# Patient Record
Sex: Male | Born: 1965
Health system: Southern US, Community
[De-identification: ages and names within clinical notes are randomized; demographics above are authoritative.]

## PROBLEM LIST (undated history)

## (undated) DIAGNOSIS — E079 Disorder of thyroid, unspecified: Secondary | ICD-10-CM

## (undated) DIAGNOSIS — R0600 Dyspnea, unspecified: Secondary | ICD-10-CM

## (undated) DIAGNOSIS — I10 Essential (primary) hypertension: Secondary | ICD-10-CM

## (undated) DIAGNOSIS — G4730 Sleep apnea, unspecified: Secondary | ICD-10-CM

## (undated) DIAGNOSIS — M79672 Pain in left foot: Secondary | ICD-10-CM

## (undated) DIAGNOSIS — E785 Hyperlipidemia, unspecified: Secondary | ICD-10-CM

## (undated) DIAGNOSIS — M79606 Pain in leg, unspecified: Secondary | ICD-10-CM

## (undated) DIAGNOSIS — R1310 Dysphagia, unspecified: Secondary | ICD-10-CM

## (undated) DIAGNOSIS — M79671 Pain in right foot: Secondary | ICD-10-CM

## (undated) DIAGNOSIS — K219 Gastro-esophageal reflux disease without esophagitis: Secondary | ICD-10-CM

## (undated) DIAGNOSIS — F419 Anxiety disorder, unspecified: Secondary | ICD-10-CM

## (undated) DIAGNOSIS — R600 Localized edema: Secondary | ICD-10-CM

## (undated) DIAGNOSIS — M2550 Pain in unspecified joint: Secondary | ICD-10-CM

## (undated) DIAGNOSIS — M549 Dorsalgia, unspecified: Secondary | ICD-10-CM

## (undated) HISTORY — DX: Localized edema: R60.0

## (undated) HISTORY — DX: Dyspnea, unspecified: R06.00

## (undated) HISTORY — DX: Pain in unspecified joint: M25.50

## (undated) HISTORY — DX: Dysphagia, unspecified: R13.10

## (undated) HISTORY — DX: Essential (primary) hypertension: I10

## (undated) HISTORY — DX: Gastro-esophageal reflux disease without esophagitis: K21.9

## (undated) HISTORY — DX: Disorder of thyroid, unspecified: E07.9

## (undated) HISTORY — DX: Pain in leg, unspecified: M79.606

## (undated) HISTORY — DX: Pain in left foot: M79.672

## (undated) HISTORY — DX: Pain in right foot: M79.671

## (undated) HISTORY — DX: Sleep apnea, unspecified: G47.30

## (undated) HISTORY — DX: Anxiety disorder, unspecified: F41.9

## (undated) HISTORY — DX: Hyperlipidemia, unspecified: E78.5

## (undated) HISTORY — DX: Dorsalgia, unspecified: M54.9

## (undated) HISTORY — PX: FINGER SURGERY: SHX640

---

## 2014-03-02 DIAGNOSIS — Z6841 Body Mass Index (BMI) 40.0 and over, adult: Secondary | ICD-10-CM

## 2014-08-17 DIAGNOSIS — E785 Hyperlipidemia, unspecified: Secondary | ICD-10-CM | POA: Insufficient documentation

## 2017-02-15 ENCOUNTER — Ambulatory Visit (INDEPENDENT_AMBULATORY_CARE_PROVIDER_SITE_OTHER): Payer: BLUE CROSS/BLUE SHIELD | Admitting: Pediatrics

## 2017-02-15 ENCOUNTER — Encounter: Payer: Self-pay | Admitting: Pediatrics

## 2017-02-15 VITALS — BP 124/83 | HR 84 | Temp 97.8°F | Ht 73.0 in | Wt 326.6 lb

## 2017-02-15 DIAGNOSIS — Z0000 Encounter for general adult medical examination without abnormal findings: Secondary | ICD-10-CM

## 2017-02-15 DIAGNOSIS — R609 Edema, unspecified: Secondary | ICD-10-CM

## 2017-02-15 DIAGNOSIS — Z6841 Body Mass Index (BMI) 40.0 and over, adult: Secondary | ICD-10-CM

## 2017-02-15 DIAGNOSIS — R0683 Snoring: Secondary | ICD-10-CM

## 2017-02-15 LAB — BAYER DCA HB A1C WAIVED: HB A1C (BAYER DCA - WAIVED): 5.1 %

## 2017-02-15 MED ORDER — HYDROCHLOROTHIAZIDE 12.5 MG PO TABS: 12.5000 mg | ORAL_TABLET | Freq: Every day | ORAL | 3 refills | Status: DC

## 2017-02-15 NOTE — Progress Notes (Signed)
  Subjective:   Patient ID: Joe Romero, male    DOB: [birth year], 51 y.o.   MRN: [id number] CC: Annual Exam  HPI: Joe Romero is a 51 y.o. 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 often Says he eats when he worries Sometimes 4-5 meals a day Will make himself a hamburger between meals Wife, daughter, father live at home They are also all interested in healthier eating Drinks 10-12 sodas a day, mostly diet at home bc father with Dm[employer]  Snores heavily, has been told he has pauses with his breathing by family members  Not regularly exercising, gets tired more easily compared to the past Denies chest pain/pressure with exercise  PMH: overweight  Fam HX: no family h/o prostate cancer or colon ca  Social History   Social History  . Marital status: Married    Spouse name: N/A  . Number of children: N/A  . Years of education: N/A   Social History Main Topics  . Smoking status: Former Smoker    Quit date: 02/16/2007  . Smokeless tobacco: Never Used  . Alcohol use No  . Drug use: No  . Sexual activity: Not Asked   Other Topics Concern  . None   Social History Narrative  . None   ROS: All systems negative other than what is in HPI  Objective:    BP 124/83   Pulse 84   Temp 97.8 F (36.6 C) (Oral)   Ht '6\' 1"'$  (1.854 m)   Wt (!) 326 lb 9.6 oz (148.1 kg)   BMI 43.09 kg/m   Wt Readings from Last 3 Encounters:  02/15/17 (!) 326 lb 9.6 oz (148.1 kg)    Gen: NAD, alert, cooperative with exam, NCAT EYES: EOMI, no conjunctival injection, or no icterus ENT:  TMs pearly gray b/l, OP without erythema LYMPH: no cervical LAD CV: NRRR, normal S1/S2, no murmur, distal pulses 2+ b/l Resp: CTABL, no wheezes, normal WOB Abd: +BS, soft, NTND, obese. no guarding or organomegaly Ext: trace  pitting edema, warm Neuro: Alert and oriented, strength equal b/l UE and LE, coordination grossly normal MSK: normal muscle bulk  Assessment & Plan:  Joe Romero was seen today for annual exam.  Diagnoses and all orders for this visit:  Encounter for preventive health examination -     CMP14+EGFR -     Lipid panel  BMI 40.0-44.9, adult ([place]) Pt going to schedule appt to talk with Tammy about nutrition Goal three meals a day, decreasing eating out, no seconds -     TSH -     Bayer DCA Hb A1c Waived  Swelling Goes down by the morning Start below -     hydrochlorothiazide (HYDRODIURIL) 12.5 MG tablet; Take 1 tablet (12.5 mg total) by mouth daily.  Snoring -     Ambulatory referral to Neurology   Follow up plan: Return in about 2 months (around 04/17/2017). Assunta Found, MD [place]

## 2017-02-15 NOTE — Patient Instructions (Signed)
Schedule appt to see Tammy to talk about nutrition when able

## 2017-02-16 LAB — CMP14+EGFR
ALT: 40 IU/L (ref 0–44)
AST: 25 IU/L (ref 0–40)
Albumin/Globulin Ratio: 1.7 (ref 1.2–2.2)
Albumin: 4.3 g/dL (ref 3.5–5.5)
Alkaline Phosphatase: 54 IU/L (ref 39–117)
BILIRUBIN TOTAL: 0.4 mg/dL (ref 0.0–1.2)
BUN/Creatinine Ratio: 15 (ref 9–20)
BUN: 16 mg/dL (ref 6–24)
CHLORIDE: 99 mmol/L (ref 96–106)
CO2: 26 mmol/L (ref 20–29)
Calcium: 8.9 mg/dL (ref 8.7–10.2)
Creatinine, Ser: 1.05 mg/dL (ref 0.76–1.27)
GFR calc non Af Amer: 83 mL/min/{1.73_m2} (ref >59)
GFR, EST AFRICAN AMERICAN: 96 mL/min/{1.73_m2} (ref >59)
GLUCOSE: 91 mg/dL (ref 65–99)
Globulin, Total: 2.5 g/dL (ref 1.5–4.5)
Potassium: 4.6 mmol/L (ref 3.5–5.2)
Sodium: 141 mmol/L (ref 134–144)
TOTAL PROTEIN: 6.8 g/dL (ref 6.0–8.5)

## 2017-02-16 LAB — LIPID PANEL
CHOLESTEROL TOTAL: 186 mg/dL (ref 100–199)
Chol/HDL Ratio: 5.2 ratio — ABNORMAL HIGH (ref 0.0–5.0)
HDL: 36 mg/dL — AB (ref >39)
LDL Calculated: 117 mg/dL — ABNORMAL HIGH (ref 0–99)
TRIGLYCERIDES: 167 mg/dL — AB (ref 0–149)
VLDL CHOLESTEROL CAL: 33 mg/dL (ref 5–40)

## 2017-02-16 LAB — TSH: TSH: 12.09 u[IU]/mL — ABNORMAL HIGH (ref 0.450–4.500)

## 2017-02-18 ENCOUNTER — Ambulatory Visit: Payer: BLUE CROSS/BLUE SHIELD | Admitting: Pharmacist

## 2017-02-18 ENCOUNTER — Encounter: Payer: Self-pay | Admitting: Neurology

## 2017-02-18 ENCOUNTER — Ambulatory Visit (INDEPENDENT_AMBULATORY_CARE_PROVIDER_SITE_OTHER): Payer: BLUE CROSS/BLUE SHIELD | Admitting: Neurology

## 2017-02-18 ENCOUNTER — Other Ambulatory Visit: Payer: Self-pay | Admitting: Pediatrics

## 2017-02-18 VITALS — BP 132/85 | HR 84 | Ht 73.0 in | Wt 324.0 lb

## 2017-02-18 DIAGNOSIS — Z6841 Body Mass Index (BMI) 40.0 and over, adult: Secondary | ICD-10-CM

## 2017-02-18 DIAGNOSIS — R0683 Snoring: Secondary | ICD-10-CM

## 2017-02-18 DIAGNOSIS — G2581 Restless legs syndrome: Secondary | ICD-10-CM

## 2017-02-18 DIAGNOSIS — R351 Nocturia: Secondary | ICD-10-CM | POA: Diagnosis not present

## 2017-02-18 DIAGNOSIS — E039 Hypothyroidism, unspecified: Secondary | ICD-10-CM

## 2017-02-18 DIAGNOSIS — R600 Localized edema: Secondary | ICD-10-CM

## 2017-02-18 DIAGNOSIS — R0681 Apnea, not elsewhere classified: Secondary | ICD-10-CM | POA: Diagnosis not present

## 2017-02-18 DIAGNOSIS — R400 Somnolence: Secondary | ICD-10-CM | POA: Diagnosis not present

## 2017-02-18 MED ORDER — LEVOTHYROXINE SODIUM 112 MCG PO TABS: 112.0000 ug | ORAL_TABLET | Freq: Every day | ORAL | 2 refills | Status: DC

## 2017-02-18 NOTE — Progress Notes (Signed)
Subjective:    Patient ID: Joe SessionsRicky Romero is a 51 y.o. male.  HPI     Joe FoleySaima Lord Lancour, MD, PhD [facility] [address], [address] [address] [place], [place] [address]  Dear Dr. Oswaldo DoneVincent,   I saw your patient, Joe Romero, upon your kind request in my neurologic clinic today for initial consultation of hissleep disorder, in particular, concern for underlying obstructive sleep apnea. The patient is unaccompanied today. As you know, Joe Romero is a 51 year old right-handed gentleman with an underlying medical history of lower extremity edema and morbid obesity with a BMI of over 40, who reports snoring and excessive daytime somnolence. He has woken up with a sense of gasping for air and wife has noted apneic pauses while he is asleep. I reviewed your office note from 02/15/2017. His ESS is 16/24, fatigue score is 23/63. He goes to bed between 8:30 and 9:30 PM, rise time is around 4:15 AM, at work by 6 AM. He works at [employer], Consulting civil engineermanager for equipment maintenance. He has RLS symptoms, sometimes bothersome to him, has nocturia, now since he started on HCTZ and lost about 6 lb. Swelling is much better. Denies AM HAs. Wakes up with dry mouth a lot, sleeps with mouth open. Was found to have elevated TSH and is supposed to start Synthroid per chart review. He quit smoking in 2010, he does not drink any alcohol regularly, maybe once or twice a year or so, he has been drinking quite a bit of caffeine until recently when he was advised to reduce his caffeine intake. He was drinking up to 10 caffeine-containing sodas per day but has been switching to decaf and has tried to increase his water intake. He has no known family history of sleep apnea. He has 4 children and 4 grandchildren, another grandchild on the way.   His Past Medical History Is Significant For: No past medical history on file.  His Past Surgical History Is Significant For: Past Surgical History:  Procedure Laterality Date  .  FINGER SURGERY Right    torn ligament    His Family History Is Significant For: No family history on file.  His Social History Is Significant For: Social History   Social History  . Marital status: Married    Spouse name: N/A  . Number of children: N/A  . Years of education: N/A   Social History Main Topics  . Smoking status: Former Smoker    Quit date: 02/16/2007  . Smokeless tobacco: Never Used  . Alcohol use No  . Drug use: No  . Sexual activity: Not Asked   Other Topics Concern  . None   Social History Narrative  . None    His Allergies Are:  No Known Allergies:   His Current Medications Are:  Outpatient Encounter Prescriptions as of 02/18/2017  Medication Sig  . hydrochlorothiazide (HYDRODIURIL) 12.5 MG tablet Take 1 tablet (12.5 mg total) by mouth daily.  Marland Kitchen. levothyroxine (SYNTHROID, LEVOTHROID) 112 MCG tablet Take 1 tablet (112 mcg total) by mouth daily. (Patient not taking: Reported on 02/18/2017)   No facility-administered encounter medications on file as of 02/18/2017.   :  Review of Systems:  Out of a complete 14 point review of systems, all are reviewed and negative with the exception of these symptoms as listed below: Review of Systems  Neurological:       Pt presents today to discuss his sleep. Pt has never had a sleep study but does endorse snoring.  Epworth Sleepiness Scale 0= would never doze 1= slight chance of dozing 2= moderate chance of dozing 3= high chance of dozing  Sitting and reading: 3 Watching TV: 3 Sitting inactive in a public place (ex. Theater or meeting): 1 As a passenger in a car for an hour without a break: 3 Lying down to rest in the afternoon: 3 Sitting and talking to someone: 1 Sitting quietly after lunch (no alcohol): 2 In a car, while stopped in traffic: 0 Total: 16     Objective:  Neurologic Exam  Physical Exam Physical Examination:   Vitals:   02/18/17 0832  BP: 132/85  Pulse: 84    General  Examination: The patient is a very pleasant 51 y.o. male in no acute distress. He appears well-developed and well-nourished and well groomed.   HEENT: Normocephalic, atraumatic, pupils are equal, round and reactive to light and accommodation. Funduscopic exam is normal with sharp disc margins noted. Extraocular tracking is good without limitation to gaze excursion or nystagmus noted. Normal smooth pursuit is noted. Hearing is grossly intact. Face is symmetric with normal facial animation and normal facial sensation. Speech is clear with no dysarthria noted. There is no hypophonia. There is no lip, neck/head, jaw or voice tremor. Neck is supple with full range of passive and active motion. There are no carotid bruits on auscultation. Oropharynx exam reveals: mild mouth dryness, adequate dental hygiene and moderate airway crowding, due to larger uvula, tonsils of 2+ on the R and 1+ on the L. Mallampati is class II. Tongue protrudes centrally and palate elevates symmetrically. Neck size is 19.5 inches. He has a Mild overbite. Nasal inspection reveals no significant nasal mucosal bogginess or redness and no septal deviation.   Chest: Clear to auscultation without wheezing, rhonchi or crackles noted.  Heart: S1+S2+0, regular and normal without murmurs, rubs or gallops noted.   Abdomen: Soft, non-tender and non-distended with normal bowel sounds appreciated on auscultation.  Extremities: There is trace pitting edema in the distal lower extremities bilaterally. Pedal pulses are intact.  Skin: Warm and dry without trophic changes noted.  Musculoskeletal: exam reveals no obvious joint deformities, tenderness or joint swelling or erythema.   Neurologically:  Mental status: The patient is awake, alert and oriented in all 4 spheres. His immediate and remote memory, attention, language skills and fund of knowledge are appropriate. There is no evidence of aphasia, agnosia, apraxia or anomia. Speech is clear with  normal prosody and enunciation. Thought process is linear. Mood is normal and affect is normal.  Cranial nerves II - XII are as described above under HEENT exam. In addition: shoulder shrug is normal with equal shoulder height noted. Motor exam: Normal bulk, strength and tone is noted. There is no drift, tremor or rebound. Romberg is negative. Reflexes are 2+ in the UEs and 1+ in the LEs. Fine motor skills and coordination: intact with normal finger taps, normal hand movements, normal rapid alternating patting, normal foot taps and normal foot agility.  Cerebellar testing: No dysmetria or intention tremor on finger to nose testing. Heel to shin is unremarkable bilaterally. There is no truncal or gait ataxia.  Sensory exam: intact to light touch in the upper and lower extremities.  Gait, station and balance: He stands easily. No veering to one side is noted. No leaning to one side is noted. Posture is age-appropriate and stance is narrow based. Gait shows normal stride length and normal pace. No problems turning are noted. Tandem walk is unremarkable. Intact toe  and heel stance is noted.               Assessment and Plan:   In summary, Joe Romero is a very pleasant 51 y.o.-year old male with an underlying medical history of lower extremity edema, recently diagnosed hypothyroidism, and morbid obesity with a BMI of over 40, whose history and physical exam are concerning for obstructive sleep apnea (OSA). I had a long chat with the patient about my findings and the diagnosis of OSA, its prognosis and treatment options. We talked about medical treatments, surgical interventions and non-pharmacological approaches. I explained in particular the risks and ramifications of untreated moderate to severe OSA, especially with respect to developing cardiovascular disease down the Road, including congestive heart failure, difficult to treat hypertension, cardiac arrhythmias, or stroke. Even type 2 diabetes has, in part,  been linked to untreated OSA. Symptoms of untreated OSA include daytime sleepiness, memory problems, mood irritability and mood disorder such as depression and anxiety, lack of energy, as well as recurrent headaches, especially morning headaches. We talked about trying to maintain a healthy lifestyle in general, as well as the importance of weight control. I encouraged the patient to eat healthy, exercise daily and keep well hydrated, to keep a scheduled bedtime and wake time routine, to not skip any meals and eat healthy snacks in between meals. I advised the patient not to drive when feeling sleepy. I recommended the following at this time: sleep study with potential positive airway pressure titration. (We will score hypopneas at 3%).   I explained the sleep test procedure to the patient and also outlined possible surgical and non-surgical treatment options of OSA, including the use of a custom-made dental device (which would require a referral to a specialist dentist or oral surgeon), upper airway surgical options, such as pillar implants, radiofrequency surgery, tongue base surgery, and UPPP (which would involve a referral to an ENT surgeon). Rarely, jaw surgery such as mandibular advancement may be considered.  I also explained the CPAP treatment option to the patient, who indicated that he would be willing to try CPAP if the need arises. I explained the importance of being compliant with PAP treatment, not only for insurance purposes but primarily to improve His symptoms, and for the patient's long term health benefit, including to reduce His cardiovascular risks. I answered all his questions today and the patient was in agreement. I would like to see him back after the sleep study is completed and encouraged him to call with any interim questions, concerns, problems or updates.   Thank you very much for allowing me to participate in the care of this nice patient. If I can be of any further assistance  to you please do not hesitate to call me at [phone].  Sincerely,   Joe Foley, MD, PhD

## 2017-02-18 NOTE — Patient Instructions (Signed)

## 2017-02-19 LAB — T4, FREE: Free T4: 0.93 ng/dL (ref 0.82–1.77)

## 2017-02-19 LAB — SPECIMEN STATUS REPORT

## 2017-02-19 LAB — T3: T3, Total: 143 ng/dL (ref 71–180)

## 2017-02-22 ENCOUNTER — Ambulatory Visit (INDEPENDENT_AMBULATORY_CARE_PROVIDER_SITE_OTHER): Payer: BLUE CROSS/BLUE SHIELD | Admitting: Pharmacist

## 2017-02-22 VITALS — BP 118/70 | HR 78 | Ht 73.0 in | Wt 323.8 lb

## 2017-02-22 DIAGNOSIS — Z6841 Body Mass Index (BMI) 40.0 and over, adult: Secondary | ICD-10-CM | POA: Diagnosis not present

## 2017-02-22 DIAGNOSIS — R600 Localized edema: Secondary | ICD-10-CM | POA: Diagnosis not present

## 2017-02-22 DIAGNOSIS — E034 Atrophy of thyroid (acquired): Secondary | ICD-10-CM | POA: Diagnosis not present

## 2017-02-22 NOTE — Progress Notes (Signed)
Patient ID: Joe Romero, male   DOB: 06/27/1966, 51 y.o.   MRN: [id number]   Subjective:     Joe SessionsRicky Romero is a 51 y.o. male here for discussion regarding weight loss. He has noted a weight gain of approximately 50 pounds over the last 10 year.  He has recently had concerns about his heart and wants to lose weight for his health He feels ideal weight is 295 pounds. History of eating disorders: none. There is a family history positive for obesity in the patient, father and daughter. Previous treatments for obesity include self-directed dieting. Obesity associated medical conditions: thyroid disease and dyslipidemia (also possibly sleep apnea - patient has been referred for sleep study). Patient was just diagnosed with hypothyroidism and started on thyroid replacement which might have led to some on his weight gain. He reports he already feels much better.  Obesity associated medications: none. Cardiovascular risk factors besides obesity: dyslipidemia, male gender, obesity (BMI >= 30 kg/m2) and sedentary lifestyle.  Patient is concerned about weight and diabetes risk.  Both his father and wife have DM.  Household is patient, wife, daughter and father.  Meal Prep - pt, wife and daughter Marine scientistGrocery Shopping - pt, wife and daughter  Current Diet:  About 10 sodas per day - has decreased some and is drinking more water over the last week.   Mentions he belongs to the "clean plate club" - does not like to waste food Likes all foods.  Eats out 6 to 8 times per week  Outpatient Encounter Prescriptions as of 02/22/2017  Medication Sig  . hydrochlorothiazide (HYDRODIURIL) 12.5 MG tablet Take 1 tablet (12.5 mg total) by mouth daily.  Marland Kitchen. levothyroxine (SYNTHROID, LEVOTHROID) 112 MCG tablet Take 1 tablet (112 mcg total) by mouth daily. (Patient not taking: Reported on 02/18/2017)   No facility-administered encounter medications on file as of 02/22/2017.     The following portions of the patient's history were  reviewed and updated as appropriate: allergies, current medications, past family history, past medical history, past social history, past surgical history and problem list.    Objective:    Body mass index is 42.72 kg/m.   Weight has decreased about 4 lbs since last week  Today's Vitals   02/22/17 1117  BP: 118/70  Pulse: 78  Weight: (!) 323 lb 12.8 oz (146.9 kg)  Height: 6\' 1"  (1.854 m)    Assessment:    Obesity. I assessed Joe Romero to be in an action stage with respect to weight loss.    Plan:    General weight loss/lifestyle modification strategies discussed (elicit support from others; identify saboteurs; non-food rewards, etc). Behavioral treatment: stress management. Diet interventions: moderate (500 kCal/d) deficit diet and discussed limiting high calorie foods; limit sugar and processed foods.  Increase lean proteins, vegetables and fruits. .Discussed portion sizes. Samples diet given. Regular aerobic exercise program discussed. Medication: discussed Saxanda - checking his insurance coverage.  Patient does not want any meds that might have cardio adverse effects.. Follow up in: 4 weeks

## 2017-02-22 NOTE — Patient Instructions (Signed)
Increase exercise - goal is to get 150 minutes per week - walking is great!  Smaller meals with snacks - ideally eating every 4 hours

## 2017-02-23 DIAGNOSIS — R609 Edema, unspecified: Secondary | ICD-10-CM | POA: Insufficient documentation

## 2017-02-23 DIAGNOSIS — E039 Hypothyroidism, unspecified: Secondary | ICD-10-CM | POA: Insufficient documentation

## 2017-03-15 ENCOUNTER — Ambulatory Visit (INDEPENDENT_AMBULATORY_CARE_PROVIDER_SITE_OTHER): Payer: BLUE CROSS/BLUE SHIELD | Admitting: Neurology

## 2017-03-15 DIAGNOSIS — G4761 Periodic limb movement disorder: Secondary | ICD-10-CM

## 2017-03-15 DIAGNOSIS — G4720 Circadian rhythm sleep disorder, unspecified type: Secondary | ICD-10-CM

## 2017-03-15 DIAGNOSIS — G4733 Obstructive sleep apnea (adult) (pediatric): Secondary | ICD-10-CM | POA: Diagnosis not present

## 2017-03-19 NOTE — Procedures (Signed)
PATIENT'S NAME:  Romero, Joe DOB:      1966-02-26      MR#:    [id number]     DATE OF RECORDING: 03/15/2017 REFERRING M.D.:  Rex Kras, MD Study Performed:  Split-Night Titration Study HISTORY: 51 year old man with a history of lower extremity edema and morbid obesity, who reports snoring and excessive daytime somnolence. The patient endorsed the Epworth Sleepiness Scale at 16 points. The patient's weight 324 pounds with a height of 73 (inches), resulting in a BMI of 43. Kg/m2. The patient's neck circumference measured 19.5 inches.  CURRENT MEDICATIONS: Hydrodiuril; Synthroid   PROCEDURE:  This is a multichannel digital polysomnogram utilizing the Somnostar 11.2 system.  Electrodes and sensors were applied and monitored per AASM Specifications.   EEG, EOG, Chin and Limb EMG, were sampled at 200 Hz.  ECG, Snore and Nasal Pressure, Thermal Airflow, Respiratory Effort, CPAP Flow and Pressure, Oximetry was sampled at 50 Hz. Digital video and audio were recorded.      BASELINE STUDY WITHOUT CPAP RESULTS:  Lights Out was at 21:04 and Lights On at 05:00 for the night, CPAP initiated at 23:05, epoch 249. Total recording time (TRT) was 119.5, with a total sleep time (TST) of 106 minutes.  The patient's sleep latency was 13.5 minutes, REM sleep was absent. The sleep efficiency was 88.7 %.    SLEEP ARCHITECTURE: WASO (Wake after sleep onset) was 4.5 minutes, Stage N1 was 4 minutes, Stage N2 was 55.5 minutes, Stage N3 was 46.5 minutes and Stage R (REM sleep) was 0 minutes.  The percentages were Stage N1 3.8%, Stage N2 52.4%, Stage N3 43.9% and Stage R (REM sleep) was absent.  The arousals were noted as: 3 were spontaneous, 0 were associated with PLMs, 72 were associated with respiratory events.  Audio and video analysis did not show any abnormal or unusual movements, behaviors, phonations or vocalizations.  The patient took no bathroom breaks. Moderate to severe snoring was noted. The EKG was in keeping with  normal sinus rhythm (NSR).  RESPIRATORY ANALYSIS:  There were a total of 72 respiratory events:  6 obstructive apneas, 0 central apneas and 0 mixed apneas with a total of 6 apneas and an apnea index (AI) of 3.4. There were 66 hypopneas with a hypopnea index of 37.4. The patient also had 0 respiratory event related arousals (RERAs).  Snoring was noted.     The total APNEA/HYPOPNEA INDEX (AHI) was 40.8 /hour and the total RESPIRATORY DISTURBANCE INDEX was 40.8 /hour.  0 events occurred in REM sleep and 132 events in NREM. The REM AHI was 0, /hour versus a non-REM AHI of 40.8 /hour. The patient spent 352.5 minutes sleep time in the supine position 61 minutes in non-supine. The supine AHI was 41.3 /hour versus a non-supine AHI of 40.3 /hour.  OXYGEN SATURATION & C02:  The wake baseline 02 saturation was 94%, with the lowest being 64%. Time spent below 89% saturation equaled 60 minutes.  PERIODIC LIMB MOVEMENTS: The patient had a total of 48 Periodic Limb Movements.  The Periodic Limb Movement (PLM) index was 27.2 /hour and the PLM Arousal index was 0 /hour.  TITRATION STUDY WITH CPAP RESULTS:   The patient was fitted with medium nasal pillows. CPAP was initiated at 5 cmH20 with heated humidity per AASM split night standards and pressure was advanced to 8 cmH20 because of hypopneas, apneas and desaturations.  At a PAP pressure of 8 cmH20, there was a reduction of the AHI to 0/hour, with  supine REM sleep achieved, O2 nadir of 89%.   Total recording time (TRT) was 357 minutes, with a total sleep time (TST) of 307.5 minutes. The patient's sleep latency was 48 minutes. REM latency was 57 minutes.  The sleep efficiency was 86.1 %.    SLEEP ARCHITECTURE: Wake after sleep was 5 minutes, Stage N1 5 minutes, Stage N2 152 minutes, Stage N3 56.5 minutes and Stage R (REM sleep) 94 minutes. The percentages were: Stage N1 1.6%, Stage N2 49.4%, Stage N3 18.4% and Stage R (REM sleep) 30.6%, in keeping with REM rebound.    The arousals were noted as: 23 were spontaneous, 2 were associated with PLMs, 8 were associated with respiratory events.  RESPIRATORY ANALYSIS:  There were a total of 10 respiratory events: 4 obstructive apneas, 0 central apneas and 0 mixed apneas with a total of 4 apneas and an apnea index (AI) of .8. There were 6 hypopneas with a hypopnea index of 1.2 /hour. The patient also had 0 respiratory event related arousals (RERAs).      The total APNEA/HYPOPNEA INDEX  (AHI) was 2. /hour and the total RESPIRATORY DISTURBANCE INDEX was 2. /hour.  9 events occurred in REM sleep and 1 events in NREM. The REM AHI was 5.7 /hour versus a non-REM AHI of .3 /hour. REM sleep was achieved on a pressure of  cm/h2o (AHI was  .) The patient spent 100% of total sleep time in the supine position. The supine AHI was 2.0 /hour, versus a non-supine AHI of 0.0/hour.  OXYGEN SATURATION & C02:  The wake baseline 02 saturation was 96%, with the lowest being 86%. Time spent below 89% saturation equaled 1 minutes.  PERIODIC LIMB MOVEMENTS: The patient had a total of 74 Periodic Limb Movements. The Periodic Limb Movement (PLM) index was 14.4 /hour and the PLM Arousal index was .4 /hour.  Post-study, the patient indicated that sleep was better than usual.  POLYSOMNOGRAPHY IMPRESSION :   1. Obstructive Sleep Apnea (OSA)  2. Dysfunctions associated with sleep stages or arousals from sleep 3. Periodic Limb Movement Disorder (PLMD)  RECOMMENDATIONS:  1. This patient has severe obstructive sleep apnea and responded well on CPAP therapy. I will, therefore, start the patient on home CPAP treatment at a pressure of 8 cm via medium nasal pillows with heated humidity. The patient should be reminded to be fully compliant with PAP therapy to improve sleep related symptoms and decrease long term cardiovascular risks. Please note that untreated obstructive sleep apnea carries additional perioperative morbidity. Patients with significant  obstructive sleep apnea should receive perioperative PAP therapy and the surgeons and particularly the anesthesiologist should be informed of the diagnosis and the severity of the sleep disordered breathing. 2. Moderate PLMs (periodic limb movements of sleep) were noted during the baseline portion of the study without significant arousals; clinical correlation is recommended. 3. This study shows sleep fragmentation and abnormal sleep stage percentages; these are nonspecific findings and per se do not signify an intrinsic sleep disorder or a cause for the patient's sleep-related symptoms. Causes include (but are not limited to) the first night effect of the sleep study, circadian rhythm disturbances, medication effect or an underlying mood disorder or medical problem.  4. The patient should be cautioned not to drive, work at heights, or operate dangerous or heavy equipment when tired or sleepy. Review and reiteration of good sleep hygiene measures should be pursued with any patient. 5. The patient will [facility]  for discussion of the test results and further management strategies. The referring provider will be notified of the test results.  I certify that I have reviewed the entire raw data recording prior to the issuance of this report in accordance with the Standards of Accreditation of the American Academy of Sleep Medicine (AASM)    Huston Foley, MD, PhD Diplomat, American Board of Psychiatry and Neurology (Neurology and Sleep Medicine)

## 2017-03-19 NOTE — Progress Notes (Signed)
  Patient referred by Dr. Oswaldo DoneVincent, seen by me on 02/18/17, split study on 03/15/17. Please call and notify patient that the recent sleep study confirmed the diagnosis of severe OSA. He did well with CPAP during the study with significant improvement of the respiratory events. Therefore, I would like start the patient on CPAP therapy at home by prescribing a machine for home use. I placed the order in the chart. The patient will need a follow up appointment with me or MM or CM in about 10 weeks post set up that has to be scheduled; please go ahead and schedule while you have the patient on the phone and make sure patient understands the importance of keeping this window for the FU appointment, as it is often an insurance requirement and failing to adhere to this may result in losing coverage for sleep apnea treatment.  Please re-enforce the importance of compliance with treatment and the need for us to monitor compliance data - again an insurance requirement and good feedback for the patient as far as how they are doing.  Also remind patient, that any upcoming CPAP machine or mask issues, should be first addressed with the DME company. Please ask if patient has a preference regarding DME company.  Please arrange for CPAP set up at home through a DME company of patient's choice - once you have spoken to the patient - and faxed/routed report to PCP and referring MD (if other than PCP), you can close this encounter, thanks,   Huston FoleySaima Beva Remund, MD, PhD [facility] ([facility])

## 2017-03-19 NOTE — Addendum Note (Signed)
Addended by: Huston FoleyATHAR, [place] on: 03/19/2017 11:47 AM   Modules accepted: Orders

## 2017-03-22 ENCOUNTER — Telehealth: Payer: Self-pay

## 2017-03-22 NOTE — Telephone Encounter (Signed)
I called pt. I advised pt that Dr. Frances FurbishAthar reviewed their sleep study results and found that pt has severe osa but did well with the cpap during the sleep study with significant improvement of respiratory events. Dr. Frances FurbishAthar recommends that pt start a cpap at home. I reviewed PAP compliance expectations with the pt. Pt is agreeable to starting a CPAP. I advised pt that an order will be sent to a DME, [facility], and [facility] will call the pt within about one week after they file with the pt's insurance. [facility] will show the pt how to use the machine, fit for masks, and troubleshoot the CPAP if needed. A follow up appt was made for insurance purposes with Dr. Frances FurbishAthar on Wednesday, September 19th, 2018 at 1:00pm. Pt verbalized understanding to arrive 15 minutes early and bring their CPAP. A letter with all of this information in it will be mailed to the pt as a reminder. I verified with the pt that the address we have on file is correct. Pt verbalized understanding of results. Pt had no questions at this time but was encouraged to call back if questions arise.

## 2017-03-22 NOTE — Telephone Encounter (Signed)
-----   Message from Huston FoleySaima Athar, MD sent at 03/19/2017 11:47 AM EDT -----  Patient referred by Dr. Oswaldo DoneVincent, seen by me on 02/18/17, split study on 03/15/17. Please call and notify patient that the recent sleep study confirmed the diagnosis of severe OSA. He did well with CPAP during the study with significant improvement of the respiratory events. Therefore, I would like start the patient on CPAP therapy at home by prescribing a machine for home use. I placed the order in the chart. The patient will need a follow up appointment with me or MM or CM in about 10 weeks post set up that has to be scheduled; please go ahead and schedule while you have the patient on the phone and make sure patient understands the importance of keeping this window for the FU appointment, as it is often an insurance requirement and failing to adhere to this may result in losing coverage for sleep apnea treatment.  Please re-enforce the importance of compliance with treatment and the need for us to monitor compliance data - again an insurance requirement and good feedback for the patient as far as how they are doing.  Also remind patient, that any upcoming CPAP machine or mask issues, should be first addressed with the DME company. Please ask if patient has a preference regarding DME company.  Please arrange for CPAP set up at home through a DME company of patient's choice - once you have spoken to the patient - and faxed/routed report to PCP and referring MD (if other than PCP), you can close this encounter, thanks,   Huston FoleySaima Athar, MD, PhD [facility]

## 2017-03-24 ENCOUNTER — Ambulatory Visit (INDEPENDENT_AMBULATORY_CARE_PROVIDER_SITE_OTHER): Payer: BLUE CROSS/BLUE SHIELD | Admitting: Pharmacist

## 2017-03-24 ENCOUNTER — Encounter: Payer: Self-pay | Admitting: Pharmacist

## 2017-03-24 ENCOUNTER — Telehealth: Payer: Self-pay | Admitting: Pharmacist

## 2017-03-24 VITALS — BP 118/70 | HR 80 | Ht 73.0 in | Wt 328.0 lb

## 2017-03-24 DIAGNOSIS — R635 Abnormal weight gain: Secondary | ICD-10-CM | POA: Diagnosis not present

## 2017-03-24 DIAGNOSIS — Z6841 Body Mass Index (BMI) 40.0 and over, adult: Secondary | ICD-10-CM

## 2017-03-24 NOTE — Telephone Encounter (Signed)
Opened n error

## 2017-03-24 NOTE — Progress Notes (Signed)
Patient ID: Joe Romero, male   DOB: 11/01/1965, 51 y.o.   MRN: [id number]   Subjective:     Joe Romero is a 51 y.o. male here to recheck weight.  I had first visit with patient about 4 weeks ago when we discussed diet and exercise.  Patient reports that he has noticed that he is eating less at meals but that he is snacking more.  He mostly snacks while he is driving to help him stay alert.  His job has required him to drive and be out of town more the last month.  He also has sleep study last week which revealed sleep apnea.  He states that the day following the sleep study he felt the best he had in awhile due to using th CPAP for a portion of the night.  He is expecting to get CPAP machine in the next 7 to 10 days.  He is concerned about the effects of his weight on his heart and health.  There is a family history positive for obesity in the patient, father and daughter. Previous treatments for obesity include self-directed dieting. Obesity associated medical conditions: sleep apnea, thyroid disease and dyslipidemia . Hypothyroidism is fairly new diagnosis and started on thyroid replacement about 1 month ago  Obesity associated medications: none. Cardiovascular risk factors besides obesity: dyslipidemia, male gender, obesity (BMI >= 30 kg/m2) and sedentary lifestyle.  Current Diet:  No longer drinking sodas.  Has been eating less for meals but notes that he is snacking more.  Feels that fatigue makes this worse.  Eating more vegetables at home but not when out of town for work.    [place]   Outpatient Encounter Prescriptions as of 03/24/2017  Medication Sig  . hydrochlorothiazide (HYDRODIURIL) 12.5 MG tablet Take 1 tablet (12.5 mg total) by mouth daily.  Marland Kitchen. levothyroxine (SYNTHROID, LEVOTHROID) 112 MCG tablet Take 1 tablet (112 mcg total) by mouth daily. (Patient not taking: Reported on 02/18/2017)   No facility-administered  encounter medications on file as of 03/24/2017.        Objective:    Body mass index is 43.27 kg/m.   Today's Vitals   03/24/17 0933  BP: 118/70  Pulse: 80  Weight: (!) 328 lb (148.8 kg)  Height: 6\' 1"  (1.854 m)     Assessment:    Obesity. I assessed Joe Romero to be in a relapse stage with respect to weight loss.    Plan:    Reviewed General weight loss/lifestyle modification strategies discussed (elicit support from others; identify saboteurs; non-food rewards, etc). Recommended chewing gum during driving to decrease snacking Discussed convenieince foods that are healthier options - fruits, nuts, sunflower seeds, cheese sticks, lean meats.  Diet interventions: moderate (500 kCal/d) deficit diet and discussed limiting high calorie foods; limit sugar and processed foods.  Increase lean proteins, vegetables and fruits. Brouchure given with calorie and CHO count in various foods given.  Regular aerobic exercise program discussed. Medication: discussed phentermine - patient would like to try but his PCP is out of office until July 23rd.  Will send message to Dr Oswaldo DoneVincent to see if she feels would be appropriate.  Follow up in: 4 weeks

## 2017-04-05 ENCOUNTER — Telehealth: Payer: Self-pay | Admitting: Pediatrics

## 2017-04-05 NOTE — Telephone Encounter (Signed)
Tried to call patient to see what question he had.  No answer.

## 2017-04-07 NOTE — Telephone Encounter (Signed)
Patient asked about starting phentermine for weight loss.  I discussed with Dr Oswaldo DoneVincent and she would like to first see if weight improves after C-PAP started.  Patient has follow up with Dr Oswaldo DoneVincent 04/19/17 and will see how much progress he has made with weight at that time.

## 2017-04-19 ENCOUNTER — Ambulatory Visit (INDEPENDENT_AMBULATORY_CARE_PROVIDER_SITE_OTHER): Payer: BLUE CROSS/BLUE SHIELD | Admitting: Pediatrics

## 2017-04-19 ENCOUNTER — Encounter: Payer: Self-pay | Admitting: Pediatrics

## 2017-04-19 VITALS — BP 128/85 | HR 88 | Temp 97.8°F | Ht 73.0 in | Wt 330.4 lb

## 2017-04-19 DIAGNOSIS — R609 Edema, unspecified: Secondary | ICD-10-CM

## 2017-04-19 DIAGNOSIS — Z6841 Body Mass Index (BMI) 40.0 and over, adult: Secondary | ICD-10-CM | POA: Diagnosis not present

## 2017-04-19 DIAGNOSIS — E039 Hypothyroidism, unspecified: Secondary | ICD-10-CM | POA: Diagnosis not present

## 2017-04-19 DIAGNOSIS — G4733 Obstructive sleep apnea (adult) (pediatric): Secondary | ICD-10-CM | POA: Diagnosis not present

## 2017-04-19 MED ORDER — HYDROCHLOROTHIAZIDE 25 MG PO TABS: 25.0000 mg | ORAL_TABLET | Freq: Every day | ORAL | 1 refills | Status: DC

## 2017-04-19 NOTE — Progress Notes (Addendum)
  Subjective:   Patient ID: Joe Romero, male    DOB: 09/16/1965, 51 y.o.   MRN: [id number] CC: Follow-up (2 month) multiple med problems  HPI: Joe Romero is a 51 y.o. male presenting for Follow-up (2 month)  Here to discuss weight loss Notices that if he isnt busy, he eats Goes out ot eat a lot with his wife  Has decreased soda intake, down to 3-4 a day, was drinking 12 sodas a day  Continues to drive back and forth to [place] Sometimes has to stop to get something to eat to stay awake hasnt heard about CPAP machine yet after sleep study  Swelling improved with HCTZ  Relevant past medical, surgical, family and social history reviewed. Allergies and medications reviewed and updated. History  Smoking Status  . Former Smoker  . Quit date: 02/16/2007  Smokeless Tobacco  . Never Used   ROS: Per HPI   Objective:    BP 128/85   Pulse 88   Temp 97.8 F (36.6 C) (Oral)   Ht 6\' 1"  (1.854 m)   Wt (!) 330 lb 6.4 oz (149.9 kg)   BMI 43.59 kg/m   Wt Readings from Last 3 Encounters:  04/19/17 (!) 330 lb 6.4 oz (149.9 kg)  03/24/17 (!) 328 lb (148.8 kg)  02/22/17 (!) 323 lb 12.8 oz (146.9 kg)    Gen: NAD, alert, cooperative with exam, NCAT EYES: EOMI, no conjunctival injection, or no icterus ENT:  OP without erythema LYMPH: no cervical LAD CV: NRRR, normal S1/S2, no murmur, distal pulses 2+ b/l Resp: CTABL, no wheezes, normal WOB Abd: +BS, soft, NTND Ext: No edema, warm Neuro: Alert and oriented MSK: normal muscle bulk  Assessment & Plan:  Joe Romero was seen today for follow-up multiple med problems.  Diagnoses and all orders for this visit:  Hypothyroidism, unspecified type Recheck TSH, cont levothyroxine -     TSH  Swelling Improved, cont HCTZ -     hydrochlorothiazide (HYDRODIURIL) 25 MG tablet; Take 1 tablet (25 mg total) by mouth daily. -     Basic Metabolic Panel  OSA (obstructive sleep apnea) needs machine, will call to d/w neurology  BMI 43 Discussed wt  loss strategies, will start with OSA treatment, decreasing reg sodas to none Working with wife in th epast week to do more meal planning to avoid eating out  Follow up plan: Return in about 3 months (around 07/20/2017). Rex Krasarol Vincent, MD [facility]

## 2017-04-19 NOTE — Patient Instructions (Addendum)
Call Dr Teofilo PodAthar's office about CPAP machine

## 2017-04-20 LAB — BASIC METABOLIC PANEL
BUN / CREAT RATIO: 14 (ref 9–20)
BUN: 15 mg/dL (ref 6–24)
CO2: 25 mmol/L (ref 20–29)
CREATININE: 1.05 mg/dL (ref 0.76–1.27)
Calcium: 8.7 mg/dL (ref 8.7–10.2)
Chloride: 103 mmol/L (ref 96–106)
GFR calc Af Amer: 96 mL/min/{1.73_m2} (ref >59)
GFR calc non Af Amer: 83 mL/min/{1.73_m2} (ref >59)
GLUCOSE: 93 mg/dL (ref 65–99)
POTASSIUM: 4.3 mmol/L (ref 3.5–5.2)
SODIUM: 140 mmol/L (ref 134–144)

## 2017-04-20 LAB — TSH: TSH: 2.22 u[IU]/mL (ref 0.450–4.500)

## 2017-04-21 NOTE — Addendum Note (Signed)
Addended by: Johna SheriffVINCENT, CAROL L on: 04/21/2017 08:52 PM   Modules accepted: Level of Service

## 2017-05-17 ENCOUNTER — Other Ambulatory Visit: Payer: Self-pay | Admitting: Pediatrics

## 2017-05-17 DIAGNOSIS — E039 Hypothyroidism, unspecified: Secondary | ICD-10-CM

## 2017-05-26 ENCOUNTER — Telehealth: Payer: Self-pay

## 2017-05-26 ENCOUNTER — Ambulatory Visit: Payer: Self-pay | Admitting: Neurology

## 2017-05-26 NOTE — Telephone Encounter (Signed)
Pt has an appt with Dr. Frances Furbish today for a new cpap start. It does not appear that pt has started cpap. I called [facility]. Herbert Seta reports that she submitted authorization to [facility] on 03/23/17 and has not received approval yet. She has called [employer] about this already and has tried to contact the pt, but pt is not returning her calls and does not have a VM set up.   I called pt to discuss. No answer, no VM set up.  Pt does not need to follow up today if he has not started his cpap, and should call his insurance to discuss the delay in approval for his cpap. I will try calling the pt again later.

## 2017-05-26 NOTE — Telephone Encounter (Signed)
Pt did not show for their appt with Dr. Athar today.  

## 2017-05-27 ENCOUNTER — Encounter: Payer: Self-pay | Admitting: Neurology

## 2017-07-21 ENCOUNTER — Ambulatory Visit: Payer: BLUE CROSS/BLUE SHIELD | Admitting: Pediatrics

## 2017-11-01 DIAGNOSIS — H00025 Hordeolum internum left lower eyelid: Secondary | ICD-10-CM | POA: Diagnosis not present

## 2017-11-13 DIAGNOSIS — B029 Zoster without complications: Secondary | ICD-10-CM | POA: Diagnosis not present

## 2017-11-13 DIAGNOSIS — Z6841 Body Mass Index (BMI) 40.0 and over, adult: Secondary | ICD-10-CM | POA: Diagnosis not present

## 2017-12-08 ENCOUNTER — Encounter: Payer: Self-pay | Admitting: Pediatrics

## 2017-12-08 ENCOUNTER — Ambulatory Visit: Payer: BLUE CROSS/BLUE SHIELD | Admitting: Pediatrics

## 2017-12-08 VITALS — BP 148/88 | HR 102 | Temp 98.3°F | Ht 73.0 in | Wt 322.8 lb

## 2017-12-08 DIAGNOSIS — G629 Polyneuropathy, unspecified: Secondary | ICD-10-CM

## 2017-12-08 DIAGNOSIS — E785 Hyperlipidemia, unspecified: Secondary | ICD-10-CM

## 2017-12-08 DIAGNOSIS — G4733 Obstructive sleep apnea (adult) (pediatric): Secondary | ICD-10-CM | POA: Diagnosis not present

## 2017-12-08 DIAGNOSIS — R609 Edema, unspecified: Secondary | ICD-10-CM

## 2017-12-08 DIAGNOSIS — Z6841 Body Mass Index (BMI) 40.0 and over, adult: Secondary | ICD-10-CM

## 2017-12-08 DIAGNOSIS — E039 Hypothyroidism, unspecified: Secondary | ICD-10-CM

## 2017-12-08 DIAGNOSIS — I10 Essential (primary) hypertension: Secondary | ICD-10-CM

## 2017-12-08 MED ORDER — GABAPENTIN 300 MG PO CAPS
300.0000 mg | ORAL_CAPSULE | Freq: Two times a day (BID) | ORAL | 1 refills | Status: DC
Start: 2017-12-08 — End: 2018-03-17

## 2017-12-08 MED ORDER — HYDROCHLOROTHIAZIDE 25 MG PO TABS: 25.0000 mg | ORAL_TABLET | Freq: Every day | ORAL | 1 refills | Status: DC

## 2017-12-08 MED ORDER — LISINOPRIL 10 MG PO TABS: 10.0000 mg | ORAL_TABLET | Freq: Every day | ORAL | 3 refills | Status: DC

## 2017-12-08 MED ORDER — LEVOTHYROXINE SODIUM 112 MCG PO TABS: 112.0000 ug | ORAL_TABLET | Freq: Every day | ORAL | 1 refills | Status: DC

## 2017-12-08 NOTE — Patient Instructions (Signed)
Call insurance to see why CPAP machine not yet approved. May need to talk with neurology as well.  Start gabapentin 300mg  twice a day for foot pain  Start lisinopril 10mg  daily in the morning with hydrochlorothiazide for blood pressure  Come back in 1 week for nurse visit for BP check and labs after start of new blood pressure medicine  Schedule appointment to see me in 3 months

## 2017-12-08 NOTE — Progress Notes (Addendum)
Subjective:   Patient ID: Joe Romero, male    DOB: 10/03/1965, 52 y.o.   MRN: [id number] CC: Medication Refill  HPI: Joe Romero is a 52 y.o. male presenting for Medication Refill  Hypothyroidism: Taking his medicine daily  Obstructive sleep apnea: Has not yet received CPAP machine.  Per chart review and talking with patient sounds like insurance approval never came through when he was lost to follow-up.  Hypertension: Taking his medicine regularly.  Some days he had some swelling in his feet after being on his feet all day long.  No chest pain or shortness of breath with exertion.  Elevated BMI: Has been cutting out sugary drinks.  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  Has lost about 12 pounds in the last month.  Foot pain: Ongoing for the last few months.  Was recently put on gabapentin for shingles which helped the foot pain.  Did not cause any sleepiness.  He asks if he can continue.  Relevant past medical, surgical, family and social history reviewed. Allergies and medications reviewed and updated. Social History   Tobacco Use  Smoking Status Former Smoker  . Last attempt to quit: 02/16/2007  . Years since quitting: 10.8  Smokeless Tobacco Never Used   ROS: Per HPI   Objective:    BP (!) 148/88   Pulse (!) 102   Temp 98.3 F (36.8 C) (Oral)   Ht 6\' 1"  (1.854 m)   Wt (!) 322 lb 12.8 oz (146.4 kg)   BMI 42.59 kg/m   Wt Readings from Last 3 Encounters:  12/08/17 (!) 322 lb 12.8 oz (146.4 kg)  04/19/17 (!) 330 lb 6.4 oz (149.9 kg)  03/24/17 (!) 328 lb (148.8 kg)    Gen: NAD, alert, cooperative with exam, NCAT EYES: EOMI, no conjunctival injection, or no icterus CV: NRRR, normal S1/S2, no murmur, distal pulses 2+ b/l Resp: CTABL, no wheezes, normal WOB Abd: +BS, soft, NTND. no guarding or organomegaly Ext: No edema, warm Neuro: Alert and oriented, strength equal b/l UE and LE, coordination grossly normal MSK: normal muscle bulk  Assessment & Plan:  [name]  was seen today for medication refill.  Diagnoses and all orders for this visit:  Essential hypertension Elevated today.  Will start lisinopril.  Return to clinic in 1 week for blood pressure recheck nurse visit and to repeat labs. -     hydrochlorothiazide (HYDRODIURIL) 25 MG tablet; Take 1 tablet (25 mg total) by mouth daily. -     Basic Metabolic Panel; Future -     lisinopril (PRINIVIL,ZESTRIL) 10 MG tablet; Take 1 tablet (10 mg total) by mouth daily.  Swelling Worse when he is been on his feet all day, improved by morning.  No swelling today.  Continue hydrochlorthiazide.  Hypothyroidism, unspecified type -     levothyroxine (SYNTHROID, LEVOTHROID) 112 MCG tablet; Take 1 tablet (112 mcg total) by mouth daily.  Neuropathy -     gabapentin (NEURONTIN) 300 MG capsule; Take 1 capsule (300 mg total) by mouth 2 (two) times daily.  Morbid obesity with BMI of 40.0-44.9, adult (HCC) Lifestyle changes discussed.  Continue decreasing sugary drinks.  Pleased with his weight loss so far.  Hyperlipidemia, unspecified hyperlipidemia type ASCVD risk 6.6%.  Discussed biggest risk factor right now this is elevated blood pressure.  Will work to improve blood pressure.  Obstructive sleep apnea Severe sleep apnea.  Needs CPAP machine.  Patient to call insurance company, may need to get in touch with  neurology as well.  Let me know if he has any trouble.  Follow up plan: 3 months for follow-up with me.  1 week for blood pressure recheck and labs. Rex Kras, MD [facility]

## 2017-12-15 ENCOUNTER — Other Ambulatory Visit: Payer: BLUE CROSS/BLUE SHIELD

## 2017-12-15 ENCOUNTER — Ambulatory Visit: Payer: BLUE CROSS/BLUE SHIELD | Admitting: *Deleted

## 2017-12-15 VITALS — BP 125/75 | HR 101 | Wt 321.6 lb

## 2017-12-15 DIAGNOSIS — Z0130 Encounter for examination of blood pressure without abnormal findings: Secondary | ICD-10-CM

## 2017-12-15 DIAGNOSIS — I10 Essential (primary) hypertension: Secondary | ICD-10-CM | POA: Diagnosis not present

## 2017-12-15 LAB — BASIC METABOLIC PANEL
BUN/Creatinine Ratio: 14 (ref 9–20)
BUN: 16 mg/dL (ref 6–24)
CALCIUM: 9.1 mg/dL (ref 8.7–10.2)
CO2: 26 mmol/L (ref 20–29)
CREATININE: 1.11 mg/dL (ref 0.76–1.27)
Chloride: 98 mmol/L (ref 96–106)
GFR, EST AFRICAN AMERICAN: 89 mL/min/{1.73_m2} (ref >59)
GFR, EST NON AFRICAN AMERICAN: 77 mL/min/{1.73_m2} (ref >59)
Glucose: 94 mg/dL (ref 65–99)
POTASSIUM: 4.5 mmol/L (ref 3.5–5.2)
SODIUM: 139 mmol/L (ref 134–144)

## 2017-12-15 NOTE — Progress Notes (Signed)
Pt here for BP check BP 125 75 P 101

## 2018-01-03 ENCOUNTER — Telehealth: Payer: Self-pay | Admitting: Pediatrics

## 2018-01-03 DIAGNOSIS — I10 Essential (primary) hypertension: Secondary | ICD-10-CM

## 2018-01-03 MED ORDER — LISINOPRIL 10 MG PO TABS: 10.0000 mg | ORAL_TABLET | Freq: Every day | ORAL | 0 refills | Status: DC

## 2018-01-03 NOTE — Telephone Encounter (Signed)
Rx sent to [facility]. Patient notified 

## 2018-03-15 ENCOUNTER — Telehealth: Payer: Self-pay

## 2018-03-15 ENCOUNTER — Telehealth: Payer: Self-pay | Admitting: Neurology

## 2018-03-15 DIAGNOSIS — G4733 Obstructive sleep apnea (adult) (pediatric): Secondary | ICD-10-CM

## 2018-03-15 NOTE — Telephone Encounter (Signed)
Pt wife(on [employer],Brenda [phone]) has called wanting to get pt on CPAP.  She is asking if pt would need to have another sleep study.  Please call

## 2018-03-15 NOTE — Telephone Encounter (Signed)
Has he discussed with the insurance company? He had significant sleep apnea on his sleep study that improved with CPAP.

## 2018-03-15 NOTE — Telephone Encounter (Signed)
I have reached out to [facility] to discuss. 

## 2018-03-15 NOTE — Telephone Encounter (Signed)
Pt has contacted insurance for assistance

## 2018-03-15 NOTE — Telephone Encounter (Signed)
Patient had a sleep study last year but insurance won't pay for CPAP supplies  Need to know what to do

## 2018-03-16 NOTE — Addendum Note (Signed)
Addended by: Geronimo RunningINKINS, Delmy Holdren A on: 03/16/2018 02:04 PM   Modules accepted: Orders

## 2018-03-16 NOTE — Telephone Encounter (Signed)
I called pt and explained this to him. Pt is [facility]. I reiterated compliance expectations and requirements with the pt. Pt scheduled a follow up on 06/13/18 at 9:30am with Dr. Frances FurbishAthar. Pt verbalized understanding of recommendations.

## 2018-03-16 NOTE — Telephone Encounter (Signed)
Per [facility]: "I have checked into this for ya. As long as pt still has a [employer] resources officer[employer]BS commercial insurance plan then they will accept sleep studies up to 51 years old.   [employer] does not require a new office visit note to start therapy at this point.   So for this pt; if he now wants to proceed then we would just need a new rx with pressure settings unless you all require something else on your end."  I called pt. He reports that he wants to start a cpap. Pt tells me that "someone dropped the ball" and was told that his insurance won't cover the machine. He is interested in starting the machine. I advised him that I will speak with Dr. Frances FurbishAthar; he may need another OV but not a sleep study and that I would call him to discuss this. Pt asked me to use his work number to get in touch with him.

## 2018-03-16 NOTE — Telephone Encounter (Signed)
Received VO from Dr. Frances FurbishAthar to renew the order for pt's cpap from 03/19/17 and pt will need an OV 31-90 days after starting cpap.  Order placed.

## 2018-03-17 ENCOUNTER — Encounter: Payer: Self-pay | Admitting: Pediatrics

## 2018-03-17 ENCOUNTER — Ambulatory Visit: Payer: BLUE CROSS/BLUE SHIELD | Admitting: Pediatrics

## 2018-03-17 VITALS — BP 113/73 | HR 90 | Temp 97.5°F | Ht 73.0 in | Wt 329.8 lb

## 2018-03-17 DIAGNOSIS — E039 Hypothyroidism, unspecified: Secondary | ICD-10-CM | POA: Diagnosis not present

## 2018-03-17 DIAGNOSIS — R600 Localized edema: Secondary | ICD-10-CM | POA: Diagnosis not present

## 2018-03-17 DIAGNOSIS — E034 Atrophy of thyroid (acquired): Secondary | ICD-10-CM | POA: Diagnosis not present

## 2018-03-17 DIAGNOSIS — G629 Polyneuropathy, unspecified: Secondary | ICD-10-CM | POA: Diagnosis not present

## 2018-03-17 DIAGNOSIS — I10 Essential (primary) hypertension: Secondary | ICD-10-CM | POA: Diagnosis not present

## 2018-03-17 DIAGNOSIS — Z6841 Body Mass Index (BMI) 40.0 and over, adult: Secondary | ICD-10-CM | POA: Diagnosis not present

## 2018-03-17 MED ORDER — NALTREXONE-BUPROPION HCL ER 8-90 MG PO TB12: ORAL_TABLET | ORAL | 0 refills | Status: DC

## 2018-03-17 MED ORDER — CHLORTHALIDONE 25 MG PO TABS: 25.0000 mg | ORAL_TABLET | Freq: Every day | ORAL | 1 refills | Status: DC

## 2018-03-17 NOTE — Progress Notes (Signed)
Subjective:   Patient ID: Joe Romero, male    DOB: 05/24/1966, 52 y.o.   MRN: [id number] CC: Hypertension (3 mos fu); Hypothyroidism; Hyperlipidemia; and Sleep Apnea  HPI: Joe Romero is a 52 y.o. male   OSA: Has been in contact with insurance company, still has not yet received CPAP machine but has some calls to return per pt  Hypothyroid: Taking medicine regularly.  No cold intolerance  HTN: Taking medicine regularly.  No chest pain, shortness of breath.  No lightheadedness when he stands up.  He does sometimes have swelling in his legs.  Elevated BMI: Very interested in weight loss.  Is been very frustrated that ongoing efforts have not improved weight.  He is watching what he is eating, he is regularly active, says appetite works against him. Interested in medication assistance. Still with untreated sleep apnea as above.  Neuropathy: Leg pain improved with the gabapentin.  Relevant past medical, surgical, family and social history reviewed. Allergies and medications reviewed and updated. Social History   Tobacco Use  Smoking Status Former Smoker  . Last attempt to quit: 02/16/2007  . Years since quitting: 11.0  Smokeless Tobacco Never Used   ROS: Per HPI   Objective:    BP 113/73   Pulse 90   Temp (!) 97.5 F (36.4 C) (Oral)   Ht 6\' 1"  (1.854 m)   Wt (!) 329 lb 12.8 oz (149.6 kg)   BMI 43.51 kg/m   Wt Readings from Last 3 Encounters:  03/17/18 (!) 329 lb 12.8 oz (149.6 kg)  12/15/17 (!) 321 lb 9.6 oz (145.9 kg)  12/08/17 (!) 322 lb 12.8 oz (146.4 kg)    Gen: NAD, alert, cooperative with exam, NCAT EYES: EOMI, no conjunctival injection, or no icterus ENT:  TMs pearly gray b/l, OP without erythema LYMPH: no cervical LAD CV: NRRR, normal S1/S2, no murmur, distal pulses 2+ b/l Resp: CTABL, no wheezes, normal WOB Abd: +BS, soft, NTND. no guarding or organomegaly Ext: No edema, warm Neuro: Alert and oriented, strength equal b/l UE and LE, coordination grossly  normal MSK: normal muscle bulk  Assessment & Plan:  Joe CliffRicky was seen today for hypertension, hypothyroidism, hyperlipidemia and sleep apnea.  Diagnoses and all orders for this visit:  Essential hypertension Stable, continue below -     lisinopril (PRINIVIL,ZESTRIL) 10 MG tablet; Take 1 tablet (10 mg total) by mouth daily.  Morbid obesity with BMI of 40.0-44.9, adult [facility](HCC) Discussed options.  Patient thinks appetite control is been the hardest part.  When he exercises more he gets monthly.  He would like to try prescription medicine.  Will do trial below.  Patient given receiving cards through their website.  If due since 11 you know, can try alternate medication. -     Naltrexone-buPROPion HCl ER (CONTRAVE) 8-90 MG TB12; 1 tablet Po Qam x7d ; then 1 tab po BID x 7 d; then 2 tab in AM and 1 in PM X7d; Then 2 po BID from then on  Localized edema -     chlorthalidone (HYGROTON) 25 MG tablet; Take 1 tablet (25 mg total) by mouth daily.  Neuropathy Stable, continue below -     gabapentin (NEURONTIN) 300 MG capsule; Take 1 capsule (300 mg total) by mouth 2 (two) times daily.  Hypothyroidism, unspecified type Stable, continue below.  Due for recheck. -     levothyroxine (SYNTHROID, LEVOTHROID) 112 MCG tablet; Take 1 tablet (112 mcg total) by mouth daily.   Follow up plan: Return  in about 2 months (around 05/18/2018). Rex Kras, MD [place] [facility]

## 2018-03-17 NOTE — Patient Instructions (Signed)
[email or web address]

## 2018-03-18 LAB — TSH: TSH: 3.77 u[IU]/mL (ref 0.450–4.500)

## 2018-03-18 MED ORDER — LISINOPRIL 10 MG PO TABS: 10.0000 mg | ORAL_TABLET | Freq: Every day | ORAL | 1 refills | Status: DC

## 2018-03-18 MED ORDER — GABAPENTIN 300 MG PO CAPS: 300.0000 mg | ORAL_CAPSULE | Freq: Two times a day (BID) | ORAL | 1 refills | Status: DC

## 2018-03-18 MED ORDER — LEVOTHYROXINE SODIUM 112 MCG PO TABS
112.0000 ug | ORAL_TABLET | Freq: Every day | ORAL | 3 refills | Status: DC
Start: 2018-03-18 — End: 2019-05-17

## 2018-03-21 ENCOUNTER — Telehealth: Payer: Self-pay

## 2018-03-21 DIAGNOSIS — Z6841 Body Mass Index (BMI) 40.0 and over, adult: Principal | ICD-10-CM

## 2018-03-21 MED ORDER — NALTREXONE-BUPROPION HCL ER 8-90 MG PO TB12: 2.0000 | ORAL_TABLET | Freq: Two times a day (BID) | ORAL | 1 refills | Status: DC

## 2018-03-21 NOTE — Telephone Encounter (Signed)
Please let pt know--contrave medicine for weight loss will not be covered by insurance, with the savings card online he should be able to get it discounted as we discussed. If it is still too expensive let me know, we can try alternate medicine.

## 2018-03-21 NOTE — Telephone Encounter (Signed)
Contrave denied by insurance

## 2018-03-21 NOTE — Telephone Encounter (Signed)
Patient aware prescriptions have been sent to pharmacy.

## 2018-03-28 DIAGNOSIS — G4733 Obstructive sleep apnea (adult) (pediatric): Secondary | ICD-10-CM | POA: Diagnosis not present

## 2018-04-04 ENCOUNTER — Telehealth: Payer: Self-pay | Admitting: Pediatrics

## 2018-04-04 NOTE — Telephone Encounter (Signed)
Pt states he hasn't checked his BP but the swelling in his feet and ankles haven't gotten any better with the addition of Chlorthalidone. He also states the Contrave made his eyes "feel like they were going to eplode" so he stopped taking them. Is there something else we can do?

## 2018-04-06 ENCOUNTER — Other Ambulatory Visit: Payer: Self-pay | Admitting: Pediatrics

## 2018-04-06 NOTE — Telephone Encounter (Signed)
D/w pt, stop contrave. He has cut out sodas, drinks, now mostly dirnking water, cutting back on salt, started on CPAP nightly, swelling is much better. Continue current lifestyle changes, f/u as scheduled 3 mo.

## 2018-04-20 DIAGNOSIS — G4733 Obstructive sleep apnea (adult) (pediatric): Secondary | ICD-10-CM | POA: Diagnosis not present

## 2018-04-28 DIAGNOSIS — G4733 Obstructive sleep apnea (adult) (pediatric): Secondary | ICD-10-CM | POA: Diagnosis not present

## 2018-04-29 DIAGNOSIS — G4733 Obstructive sleep apnea (adult) (pediatric): Secondary | ICD-10-CM | POA: Diagnosis not present

## 2018-05-23 ENCOUNTER — Encounter: Payer: Self-pay | Admitting: Pediatrics

## 2018-05-23 ENCOUNTER — Ambulatory Visit (INDEPENDENT_AMBULATORY_CARE_PROVIDER_SITE_OTHER): Payer: BLUE CROSS/BLUE SHIELD | Admitting: Pediatrics

## 2018-05-23 VITALS — BP 129/76 | HR 99 | Temp 97.2°F | Ht 73.0 in | Wt 338.0 lb

## 2018-05-23 DIAGNOSIS — Z6841 Body Mass Index (BMI) 40.0 and over, adult: Secondary | ICD-10-CM | POA: Diagnosis not present

## 2018-05-23 DIAGNOSIS — G4733 Obstructive sleep apnea (adult) (pediatric): Secondary | ICD-10-CM

## 2018-05-23 MED ORDER — LIRAGLUTIDE -WEIGHT MANAGEMENT 18 MG/3ML ~~LOC~~ SOPN: 0.6000 mg | PEN_INJECTOR | Freq: Every day | SUBCUTANEOUS | 2 refills | Status: DC

## 2018-05-23 NOTE — Progress Notes (Signed)
  Subjective:   Patient ID: Joe Romero, male    DOB: 09/28/1965, 52 y.o.   MRN: [id number] CC: Medical Management of Chronic Issues  HPI: Joe Romero is a 52 y.o. male   Elevated BMI: Trial of contrave last visit, patient with intolerable side effects.  He has lost 15 pounds in the last few months when he limits his carb intake.  The weight comes back when he returns to regular diet.  OSA: Now has sleep apnea machine, has been using it for about 10 days.  He does feel better rested with it.  Relevant past medical, surgical, family and social history reviewed. Allergies and medications reviewed and updated. Social History   Tobacco Use  Smoking Status Former Smoker  . Last attempt to quit: 02/16/2007  . Years since quitting: 11.2  Smokeless Tobacco Never Used   ROS: Per HPI   Objective:    BP 129/76   Pulse 99   Temp (!) 97.2 F (36.2 C) (Oral)   Ht 6\' 1"  (1.854 m)   Wt (!) 338 lb (153.3 kg)   BMI 44.59 kg/m   Wt Readings from Last 3 Encounters:  05/23/18 (!) 338 lb (153.3 kg)  03/17/18 (!) 329 lb 12.8 oz (149.6 kg)  12/15/17 (!) 321 lb 9.6 oz (145.9 kg)    Gen: NAD, alert, cooperative with exam, NCAT EYES: EOMI, no conjunctival injection, or no icterus ENT:   OP without erythema LYMPH: no cervical LAD CV: NRRR, normal S1/S2, no murmur, distal pulses 2+ b/l Resp: CTABL, no wheezes, normal WOB Abd: +BS, soft, NTND.  Ext: No pitting edema, warm Neuro: Alert and oriented  Assessment & Plan:  Joe Romero was seen today for medical management of chronic issues.  Diagnoses and all orders for this visit:  Morbid obesity with BMI of 40.0-44.9, adult (HCC) Continue lifestyle modifications, discussed next step options including referral to weight management clinic [facility] [place].  Patient wants to wait on the referral, would like to try below.  Unclear if insurance will cover it. -     Liraglutide -Weight Management (SAXENDA) 18 MG/3ML SOPN; Inject 0.6 mg into the  skin daily.  OSA (obstructive sleep apnea) Improved symptoms now with CPAP.  Follow up plan: Return in about 3 months (around 08/22/2018). Rex Krasarol Vincent, MD [facility]

## 2018-05-27 ENCOUNTER — Other Ambulatory Visit: Payer: Self-pay | Admitting: *Deleted

## 2018-05-27 ENCOUNTER — Telehealth: Payer: Self-pay

## 2018-05-27 DIAGNOSIS — E669 Obesity, unspecified: Secondary | ICD-10-CM

## 2018-05-27 NOTE — Telephone Encounter (Signed)
[place] [facility]

## 2018-05-27 NOTE — Telephone Encounter (Signed)
Insurance denied prior auth for Saxenda   His plan does not cover this type of meds

## 2018-05-27 NOTE — Addendum Note (Signed)
Addended by: Johna SheriffVINCENT, CAROL L on: 05/27/2018 02:45 PM   Modules accepted: Orders

## 2018-05-27 NOTE — Telephone Encounter (Signed)
Please let pt know. We discussed this possibility in clinic. Continue CPAP, decreasing carbs. If he would like referral to medical weight loss clinic as we discussed, happy to put that in.

## 2018-05-27 NOTE — Telephone Encounter (Signed)
Patient aware referral has been placed.

## 2018-05-27 NOTE — Telephone Encounter (Signed)
Patient aware.  Patient would like referral

## 2018-05-29 DIAGNOSIS — G4733 Obstructive sleep apnea (adult) (pediatric): Secondary | ICD-10-CM | POA: Diagnosis not present

## 2018-06-13 ENCOUNTER — Ambulatory Visit: Payer: Self-pay | Admitting: Neurology

## 2018-06-17 ENCOUNTER — Ambulatory Visit: Payer: BLUE CROSS/BLUE SHIELD | Admitting: Pediatrics

## 2018-06-28 DIAGNOSIS — G4733 Obstructive sleep apnea (adult) (pediatric): Secondary | ICD-10-CM | POA: Diagnosis not present

## 2018-07-11 ENCOUNTER — Other Ambulatory Visit: Payer: Self-pay | Admitting: Pediatrics

## 2018-07-11 DIAGNOSIS — R600 Localized edema: Secondary | ICD-10-CM

## 2018-07-29 DIAGNOSIS — G4733 Obstructive sleep apnea (adult) (pediatric): Secondary | ICD-10-CM | POA: Diagnosis not present

## 2018-08-28 DIAGNOSIS — G4733 Obstructive sleep apnea (adult) (pediatric): Secondary | ICD-10-CM | POA: Diagnosis not present

## 2018-09-15 ENCOUNTER — Encounter (INDEPENDENT_AMBULATORY_CARE_PROVIDER_SITE_OTHER): Payer: BLUE CROSS/BLUE SHIELD

## 2018-09-20 ENCOUNTER — Ambulatory Visit (INDEPENDENT_AMBULATORY_CARE_PROVIDER_SITE_OTHER): Payer: BLUE CROSS/BLUE SHIELD | Admitting: Family Medicine

## 2018-09-20 ENCOUNTER — Encounter (INDEPENDENT_AMBULATORY_CARE_PROVIDER_SITE_OTHER): Payer: Self-pay | Admitting: Family Medicine

## 2018-09-20 VITALS — BP 114/75 | HR 88 | Temp 97.8°F | Ht 72.0 in | Wt 340.0 lb

## 2018-09-20 DIAGNOSIS — Z6841 Body Mass Index (BMI) 40.0 and over, adult: Secondary | ICD-10-CM

## 2018-09-20 DIAGNOSIS — R0602 Shortness of breath: Secondary | ICD-10-CM

## 2018-09-20 DIAGNOSIS — E038 Other specified hypothyroidism: Secondary | ICD-10-CM | POA: Diagnosis not present

## 2018-09-20 DIAGNOSIS — E7849 Other hyperlipidemia: Secondary | ICD-10-CM

## 2018-09-20 DIAGNOSIS — R5383 Other fatigue: Secondary | ICD-10-CM | POA: Diagnosis not present

## 2018-09-20 DIAGNOSIS — Z1331 Encounter for screening for depression: Secondary | ICD-10-CM

## 2018-09-20 DIAGNOSIS — Z9189 Other specified personal risk factors, not elsewhere classified: Secondary | ICD-10-CM | POA: Diagnosis not present

## 2018-09-20 DIAGNOSIS — I10 Essential (primary) hypertension: Secondary | ICD-10-CM

## 2018-09-20 DIAGNOSIS — Z0289 Encounter for other administrative examinations: Secondary | ICD-10-CM

## 2018-09-20 NOTE — Progress Notes (Signed)
Office: [phone]  /  Fax: [phone]   Dear Dr. Okey Regal L. Oswaldo Done,   Thank you for referring Joe Romero to our clinic. The following note includes my evaluation and treatment recommendations.  HPI:   Chief Complaint: OBESITY    Joe Romero has been referred by Dr. Okey Regal L. Vincent for consultation regarding his obesity and obesity related comorbidities.    Joe Romero (MR# [id number]) is a 53 y.o. male who presents on 09/20/2018 for obesity evaluation and treatment. Current BMI is Body mass index is 46.11 kg/m.   Joe Romero has been struggling with his weight for many years and has been unsuccessful in either losing weight, maintaining weight loss, or reaching his healthy weight goal.     Joe Romero attended our information session and states he is currently in the action stage of change and ready to dedicate time achieving and maintaining a healthier weight. Steward is interested in becoming our patient and working on intensive lifestyle modifications including (but not limited to) diet, exercise and weight loss.    Joe Romero states his family eats meals together he thinks his family will eat healthier with him his desired weight loss is 140 lbs he started gaining weight about 52 years old his heaviest weight ever was 352 lbs. he has significant food cravings issues  he snacks frequently in the evenings he is frequently drinking liquids with calories he frequently makes poor food choices he has problems with excessive hunger  he frequently eats larger portions than normal  he has binge eating behaviors he struggles with emotional eating    Fatigue Joe Romero feels his energy is lower than it should be. This has worsened with weight gain and has not worsened recently. Joe Romero denies daytime somnolence and denies waking up still tired. Patient is at risk for obstructive sleep apnea. Patent has a history of symptoms of Epworth sleepiness scale. Patient generally gets 7 hours of sleep per night, and states  they generally have generally restful sleep. Snoring is present. Apneic episodes are present. Epworth Sleepiness Score is 14.  Dyspnea on exertion Joe Romero notes increasing shortness of breath with exercising and seems to be worsening over time with weight gain. He notes getting out of breath sooner with activity than he used to. This has not gotten worse recently. Joe Romero denies orthopnea.  Hypertension Joe Romero is a 53 y.o. male with hypertension. Joe Romero's blood pressure is currently controlled on lisinopril 10mg  and chlorthalidone 25mg . He is working on weight loss to help control his blood pressure with the goal of decreasing his risk of heart attack and stroke. Jule denies chest pain or lightheadedness..  Hypothyroid Joe Romero has a diagnosis of hypothyroidism. He is on levothyroxine 112 mcg. He denies hot or cold intolerance, but does admit to ongoing fatigue.  Hyperlipidemia Joe Romero has hyperlipidemia and has been trying to improve his cholesterol levels with intensive lifestyle modification including a low saturated fat diet, exercise and weight loss. He is not on a statin and would like to improve with diet and weight loss.  At risk for cardiovascular disease Joe Romero is at a higher than average risk for cardiovascular disease due to hypertension, hyperlipidemia, and obesity. He currently denies any chest pain.  Depression Screen Joe Romero's Food and Mood (modified PHQ-9) score was 17. Depression screen Joe Romero 2/9 09/20/2018  Decreased Interest 3  Down, Depressed, Hopeless 1  PHQ - 2 Score 4  Altered sleeping 1  Tired, decreased energy 3  Change in appetite 3  Feeling bad or failure about yourself  3  Trouble concentrating 0  Moving slowly or fidgety/restless 3  Suicidal thoughts 0  PHQ-9 Score 17  Difficult doing work/chores Very difficult    ASSESSMENT AND PLAN:  Other fatigue - Plan: EKG 12-Lead, Vitamin B12, CBC With Differential, Comprehensive metabolic panel, Folate, Hemoglobin A1c,  Insulin, random, VITAMIN D 25 Hydroxy (Vit-D Deficiency, Fractures)  Shortness of breath on exertion  Essential hypertension - Plan: Comprehensive metabolic panel  Other specified hypothyroidism - Plan: T3, T4, free, TSH  Other hyperlipidemia - Plan: Lipid Panel With LDL/HDL Ratio  Depression screening  At risk for heart disease  Class 3 severe obesity with serious comorbidity and body mass index (BMI) of 45.0 to 49.9 in adult, unspecified obesity type (HCC)  PLAN:  Fatigue Joe Romero was informed that his fatigue may be related to obesity, depression or many other causes. Labs will be ordered, and in the meanwhile Joe Romero has agreed to work on diet, exercise and weight loss to help with fatigue. Proper sleep hygiene was discussed including the need for 7-8 hours of quality sleep each night. A sleep study was not ordered based on symptoms and Epworth score. An EKG and an indirect calorimetry was ordered today. Rishon agrees to follow up in 2 weeks.  Dyspnea on exertion Joe Romero's shortness of breath appears to be obesity related and exercise induced. He has agreed to work on weight loss and gradually increase exercise to treat his exercise induced shortness of breath. If Joe Romero follows our instructions and loses weight without improvement of his shortness of breath, we will plan to refer to pulmonology. We will monitor this condition regularly. We will order labs, an indirect calorimetry, and an EKG today. Joe Romero agrees to this plan.  Hypertension We discussed sodium restriction, working on healthy weight loss, and a regular exercise Romero as the means to achieve improved blood pressure control. We will continue to monitor his blood pressure as well as his progress with the above lifestyle modifications. He will continue his medications as prescribed and will watch for signs of hypotension as he continues his lifestyle modifications. We will order labs today and he will continue medications and start  his diet. Tres agreed with this plan and agreed to follow up as directed in 2 weeks.  Hypothyroid Joe Romero was informed of the importance of good thyroid control to help with weight loss efforts. He was also informed that supertherapeutic thyroid levels are dangerous and will not improve weight loss results. We will draw labs today and he will follow up at the agreed upon time.  Hyperlipidemia Joe Romero was informed of the American Heart Association Guidelines emphasizing intensive lifestyle modifications as the first line treatment for hyperlipidemia. We discussed many lifestyle modifications today in depth, and Joe Romero will continue to work on decreasing saturated fats such as fatty red meat, butter and many fried foods. He will also increase vegetables and lean protein in his diet and continue to work on exercise and weight loss efforts. Labs were ordered today and Bayani agrees to start his diet prescription. He will follow up as directed.  Cardiovascular risk counseling Daegon was given extended (15 minutes) coronary artery disease prevention counseling today. He is 53 y.o. male and has risk factors for heart disease including hypertension, hyperlipidemia, and obesity. We discussed intensive lifestyle modifications today with an emphasis on specific weight loss instructions and strategies. Pt was also informed of the importance of increasing exercise and decreasing saturated fats to help prevent heart disease.  Depression Screen Daltin had a strongly  positive depression screening. Depression is commonly associated with obesity and often results in emotional eating behaviors. We will monitor this closely and work on CBT to help improve the non-hunger eating patterns. Referral to Psychology may be required if no improvement is seen as he continues in our clinic.  Obesity Joe Romero is currently in the action stage of change and his goal is to continue with weight loss efforts. I recommend Dracen begin the  structured treatment plan as follows:  He has agreed to follow the Category 4 plan + an extra 50 calorie starch and 10 to 12 ozs of meat with dinner Tate has been instructed to eventually work up to a goal of 150 minutes of combined cardio and strengthening exercise per week for weight loss and overall health benefits. We discussed the following Behavioral Modification Strategies today: increasing lean protein intake, decreasing simple carbohydrates, decrease eating out, and work on meal planning and easy cooking plans   He was informed of the importance of frequent follow up visits to maximize his success with intensive lifestyle modifications for his multiple health conditions. He was informed we would discuss his lab results at his next visit unless there is a critical issue that needs to be addressed sooner. Joe Romero agreed to keep his next visit at the agreed upon time to discuss these results.  ALLERGIES: No Known Allergies  MEDICATIONS: Current Outpatient Medications on File Prior to Visit  Medication Sig Dispense Refill  . aspirin EC 81 MG tablet Take 81 mg by mouth daily.    . chlorthalidone (HYGROTON) 25 MG tablet TAKE 1 TABLET BY MOUTH ONCE DAILY 90 tablet 0  . gabapentin (NEURONTIN) 300 MG capsule Take 1 capsule (300 mg total) by mouth 2 (two) times daily. 180 capsule 1  . levothyroxine (SYNTHROID, LEVOTHROID) 112 MCG tablet Take 1 tablet (112 mcg total) by mouth daily. 90 tablet 3  . lisinopril (PRINIVIL,ZESTRIL) 10 MG tablet Take 1 tablet (10 mg total) by mouth daily. 90 tablet 1   No current facility-administered medications on file prior to visit.     PAST MEDICAL HISTORY: Past Medical History:  Diagnosis Date  . Anxiety   . Back pain   . Dyspnea   . GERD (gastroesophageal reflux disease)   . Hyperlipidemia   . Hypertension   . Joint pain   . Leg edema   . Leg pain   . Pain in both feet   . Sleep apnea   . Swallowing difficulty   . Thyroid disease     PAST  SURGICAL HISTORY: Past Surgical History:  Procedure Laterality Date  . FINGER SURGERY Right    torn ligament    SOCIAL HISTORY: Social History   Tobacco Use  . Smoking status: Former Smoker    Packs/day: 1.00    Years: 20.00    Pack years: 20.00    Last attempt to quit: 02/16/2007    Years since quitting: 11.6  . Smokeless tobacco: Never Used  Substance Use Topics  . Alcohol use: No  . Drug use: No    FAMILY HISTORY: Family History  Problem Relation Age of Onset  . Depression Mother   . Anxiety disorder Mother   . Diabetes Father   . Hypertension Father     ROS: Review of Systems  Constitutional: Positive for malaise/fatigue. Negative for weight loss.  Respiratory: Shortness of breath:  with activity.        Positive for difficulty breathing while lying down. Positive for sudden awakening from  sleep with shortness of breath.  Cardiovascular: Negative for chest pain and orthopnea.  Gastrointestinal: Positive for heartburn.       Positive for swallowing difficulty.  Musculoskeletal: Positive for back pain and joint pain.       Positive for muscle pain.  Neurological:       Negative for lightheadedness.  Endo/Heme/Allergies:       Negative for heat intolerance. Negative for cold intolerance.    PHYSICAL EXAM: Blood pressure 114/75, pulse 88, temperature 97.8 F (36.6 C), temperature source Oral, height 6' (1.829 m), weight (!) 340 lb (154.2 kg), SpO2 98 %. Body mass index is 46.11 kg/m. Physical Exam Vitals signs reviewed.  Constitutional:      Appearance: Normal appearance. He is obese.  HENT:     Head: Normocephalic and atraumatic.     Nose: Nose normal.  Eyes:     General: No scleral icterus.    Extraocular Movements: Extraocular movements intact.  Neck:     Musculoskeletal: Normal range of motion and neck supple.     Comments: Negative for thyromegaly. Cardiovascular:     Rate and Rhythm: Normal rate and regular rhythm.  Pulmonary:     Effort:  Pulmonary effort is normal. No respiratory distress.  Abdominal:     Palpations: Abdomen is soft.     Tenderness: There is no abdominal tenderness.     Comments: Positive for obesity.  Musculoskeletal:     Comments: ROM normal in all extremities.  Skin:    General: Skin is warm and dry.  Neurological:     Mental Status: He is alert and oriented to person, place, and time.     Coordination: Coordination normal.  Psychiatric:        Mood and Affect: Mood normal.        Behavior: Behavior normal.     RECENT LABS AND TESTS: BMET    Component Value Date/Time   NA 139 12/15/2017 0832   K 4.5 12/15/2017 0832   CL 98 12/15/2017 0832   CO2 26 12/15/2017 0832   GLUCOSE 94 12/15/2017 0832   BUN 16 12/15/2017 0832   CREATININE 1.11 12/15/2017 0832   CALCIUM 9.1 12/15/2017 0832   GFRNONAA 77 12/15/2017 0832   GFRAA 89 12/15/2017 0832   Lab Results  Component Value Date   HGBA1C 5.1 02/15/2017   No results found for: INSULIN CBC No results found for: WBC, RBC, HGB, HCT, PLT, MCV, MCH, MCHC, RDW, LYMPHSABS, MONOABS, EOSABS, BASOSABS Iron/TIBC/Ferritin/ %Sat No results found for: IRON, TIBC, FERRITIN, IRONPCTSAT Lipid Panel     Component Value Date/Time   CHOL 186 02/15/2017 1003   TRIG 167 (H) 02/15/2017 1003   HDL 36 (L) 02/15/2017 1003   CHOLHDL 5.2 (H) 02/15/2017 1003   LDLCALC 117 (H) 02/15/2017 1003   Hepatic Function Panel     Component Value Date/Time   PROT 6.8 02/15/2017 1003   ALBUMIN 4.3 02/15/2017 1003   AST 25 02/15/2017 1003   ALT 40 02/15/2017 1003   ALKPHOS 54 02/15/2017 1003   BILITOT 0.4 02/15/2017 1003      Component Value Date/Time   TSH 3.770 03/17/2018 0955   TSH 2.220 04/19/2017 1041   TSH 12.090 (H) 02/15/2017 1003    ECG  shows NSR with a rate of 95 BPM. INDIRECT CALORIMETER done today shows a VO2 of 432 and a REE of 3009.  His calculated basal metabolic rate is [id number] thus his basal metabolic rate is better than expected.  OBESITY  BEHAVIORAL INTERVENTION VISIT  Today's visit was # 1   Starting weight: 340 lbs Starting date: 09/20/2018 Today's weight : Weight: (!) 340 lb (154.2 kg)  Today's date: 09/20/2018 Total lbs lost to date: 0  ASK: We discussed the diagnosis of obesity with Roetta Sessionsicky Wheeler today and Deionte agreed to give us permission to discuss obesity behavioral modification therapy today.  ASSESS: [name] has the diagnosis of obesity and his BMI today is 46.1. [name] is in the action stage of change.   ADVISE: [name] was educated on the multiple health risks of obesity as well as the benefit of weight loss to improve his health. He was advised of the need for long term treatment and the importance of lifestyle modifications to improve his current health and to decrease his risk of future health problems.  AGREE: Multiple dietary modification options and treatment options were discussed and Mendel agreed to follow the recommendations documented in the above note.  ARRANGE: [name] was educated on the importance of frequent visits to treat obesity as outlined per CMS and USPSTF guidelines and agreed to schedule his next follow up appointment today.  I, Kirke Corinara Soares, am acting as transcriptionist for Wilder Gladearen D. Nelia Rogoff, MD  I have reviewed the above documentation for accuracy and completeness, and I agree with the above. -Quillian Quincearen Caryle Helgeson, MD

## 2018-09-26 LAB — CBC WITH DIFFERENTIAL
Basophils Absolute: 0.1 10*3/uL (ref 0.0–0.2)
Basos: 1 %
EOS (ABSOLUTE): 0.1 10*3/uL (ref 0.0–0.4)
Eos: 1 %
Hematocrit: 42.2 % (ref 37.5–51.0)
Hemoglobin: 14.3 g/dL (ref 13.0–17.7)
Immature Grans (Abs): 0.1 10*3/uL (ref 0.0–0.1)
Immature Granulocytes: 1 %
LYMPHS ABS: 2.2 10*3/uL (ref 0.7–3.1)
Lymphs: 19 %
MCH: 30.5 pg (ref 26.6–33.0)
MCHC: 33.9 g/dL (ref 31.5–35.7)
MCV: 90 fL (ref 79–97)
Monocytes Absolute: 0.8 10*3/uL (ref 0.1–0.9)
Monocytes: 7 %
Neutrophils Absolute: 8.3 10*3/uL — ABNORMAL HIGH (ref 1.4–7.0)
Neutrophils: 71 %
RBC: 4.69 x10E6/uL (ref 4.14–5.80)
RDW: 12.7 % (ref 11.6–15.4)
WBC: 11.6 10*3/uL — ABNORMAL HIGH (ref 3.4–10.8)

## 2018-09-26 LAB — COMPREHENSIVE METABOLIC PANEL
ALT: 52 IU/L — ABNORMAL HIGH (ref 0–44)
AST: 29 IU/L (ref 0–40)
Albumin/Globulin Ratio: 2 (ref 1.2–2.2)
Albumin: 4.6 g/dL (ref 3.5–5.5)
Alkaline Phosphatase: 64 IU/L (ref 39–117)
BUN/Creatinine Ratio: 17 (ref 9–20)
BUN: 20 mg/dL (ref 6–24)
Bilirubin Total: 0.4 mg/dL (ref 0.0–1.2)
CO2: 21 mmol/L (ref 20–29)
Calcium: 9.4 mg/dL (ref 8.7–10.2)
Chloride: 92 mmol/L — ABNORMAL LOW (ref 96–106)
Creatinine, Ser: 1.18 mg/dL (ref 0.76–1.27)
GFR calc Af Amer: 82 mL/min/{1.73_m2} (ref >59)
GFR calc non Af Amer: 71 mL/min/{1.73_m2} (ref >59)
GLUCOSE: 101 mg/dL — AB (ref 65–99)
Globulin, Total: 2.3 g/dL (ref 1.5–4.5)
Potassium: 4.2 mmol/L (ref 3.5–5.2)
Sodium: 134 mmol/L (ref 134–144)
Total Protein: 6.9 g/dL (ref 6.0–8.5)

## 2018-09-26 LAB — HEMOGLOBIN A1C
Est. average glucose Bld gHb Est-mCnc: 111 mg/dL
Hgb A1c MFr Bld: 5.5 % (ref 4.8–5.6)

## 2018-09-26 LAB — LIPID PANEL WITH LDL/HDL RATIO
Cholesterol, Total: 251 mg/dL — ABNORMAL HIGH (ref 100–199)
HDL: 37 mg/dL — ABNORMAL LOW (ref >39)
LDL Calculated: 178 mg/dL — ABNORMAL HIGH (ref 0–99)
LDl/HDL Ratio: 4.8 ratio — ABNORMAL HIGH (ref 0.0–3.6)
Triglycerides: 179 mg/dL — ABNORMAL HIGH (ref 0–149)
VLDL Cholesterol Cal: 36 mg/dL (ref 5–40)

## 2018-09-26 LAB — INSULIN, RANDOM: INSULIN: 23.7 u[IU]/mL (ref 2.6–24.9)

## 2018-09-26 LAB — TSH: TSH: 5.4 u[IU]/mL — ABNORMAL HIGH (ref 0.450–4.500)

## 2018-09-26 LAB — VITAMIN D 25 HYDROXY (VIT D DEFICIENCY, FRACTURES): VIT D 25 HYDROXY: 15.2 ng/mL — AB (ref 30.0–100.0)

## 2018-09-26 LAB — VITAMIN B12: Vitamin B-12: 394 pg/mL (ref 232–1245)

## 2018-09-26 LAB — FOLATE: Folate: 12.1 ng/mL (ref >3.0)

## 2018-09-26 LAB — T4, FREE: Free T4: 1.36 ng/dL (ref 0.82–1.77)

## 2018-09-26 LAB — T3: T3, Total: 114 ng/dL (ref 71–180)

## 2018-09-28 DIAGNOSIS — G4733 Obstructive sleep apnea (adult) (pediatric): Secondary | ICD-10-CM | POA: Diagnosis not present

## 2018-10-04 ENCOUNTER — Ambulatory Visit (INDEPENDENT_AMBULATORY_CARE_PROVIDER_SITE_OTHER): Payer: BLUE CROSS/BLUE SHIELD | Admitting: Family Medicine

## 2018-10-04 ENCOUNTER — Encounter (INDEPENDENT_AMBULATORY_CARE_PROVIDER_SITE_OTHER): Payer: Self-pay | Admitting: Family Medicine

## 2018-10-04 VITALS — BP 108/70 | HR 87 | Ht 72.0 in | Wt 328.0 lb

## 2018-10-04 DIAGNOSIS — R945 Abnormal results of liver function studies: Secondary | ICD-10-CM

## 2018-10-04 DIAGNOSIS — R7303 Prediabetes: Secondary | ICD-10-CM | POA: Diagnosis not present

## 2018-10-04 DIAGNOSIS — E559 Vitamin D deficiency, unspecified: Secondary | ICD-10-CM | POA: Diagnosis not present

## 2018-10-04 DIAGNOSIS — Z9189 Other specified personal risk factors, not elsewhere classified: Secondary | ICD-10-CM | POA: Diagnosis not present

## 2018-10-04 DIAGNOSIS — Z6841 Body Mass Index (BMI) 40.0 and over, adult: Secondary | ICD-10-CM

## 2018-10-04 DIAGNOSIS — R7989 Other specified abnormal findings of blood chemistry: Secondary | ICD-10-CM

## 2018-10-04 DIAGNOSIS — E782 Mixed hyperlipidemia: Secondary | ICD-10-CM | POA: Diagnosis not present

## 2018-10-04 MED ORDER — VITAMIN D (ERGOCALCIFEROL) 1.25 MG (50000 UNIT) PO CAPS: 50000.0000 [IU] | ORAL_CAPSULE | ORAL | 0 refills | Status: DC

## 2018-10-05 NOTE — Progress Notes (Signed)
Office: [phone]  /  Fax: [phone]   HPI:   Chief Complaint: OBESITY Joe Romero is here to discuss his progress with his obesity treatment plan. He is on the Category 4 plan and is following his eating plan approximately 80 % of the time. He states he is exercising 0 minutes 0 times per week.   His weight is (!) 328 lb (148.8 kg) today and has had a weight loss of 12 pounds over a period of 2 weeks since his last visit. He has lost 12 lbs since starting treatment with us.  Vitamin D deficiency Joe Romero has a new diagnosis of vitamin D deficiency. His vit D is very low and he admits fatigue.  Pre-Diabetes Joe Romero has a diagnosis of pre-diabetes. His Hgb A1c  Was within normal limits, but his fasting glucose and fasting Insulin was elevated. He was informed this puts him at greater risk of developing diabetes. He is not taking metformin currently and continues to work on diet and exercise to decrease risk of diabetes. He admits polyphagia has greatly improved on his diet.  At risk for diabetes Joe Romero is at higher than average risk for developing diabetes due to his pre-diabetes and obesity. He currently denies polyuria or polydipsia.  Hyperlipidemia Joe Romero has hyperlipidemia and has been trying to improve his cholesterol levels with intensive lifestyle modification including a low saturated fat diet, exercise and weight loss. He is not on a statin and would like to attempt diet control. He denies chest pain.  Elevated Liver Function Tests Joe Romero likely has a fatty liver. He has had an occasional "stitch" in his Right side, but not since losing weight. He denies jaundice or excessive ETOH or Tylenol.  ASSESSMENT AND PLAN:  Vitamin D deficiency - Plan: Vitamin D, Ergocalciferol, (DRISDOL) 1.25 MG (50000 UT) CAPS capsule  Prediabetes  Mixed hyperlipidemia  Elevated LFTs  At risk for diabetes mellitus  Class 3 severe obesity with serious comorbidity and body mass index (BMI) of 40.0 to  44.9 in adult, unspecified obesity type (HCC)  PLAN:  Vitamin D Deficiency Jerami was informed that low vitamin D levels contributes to fatigue and are associated with obesity, breast, and colon cancer. He agrees to start to take prescription Vit D @50 ,000 IU every week #4 with no refills and will follow up for routine testing of vitamin D, at least 2-3 times per year. He was informed of the risk of over-replacement of vitamin D and agrees to not increase his dose unless he discusses this with us first. We will recheck labs in 3 months. Joe Romero agrees to follow up in 2 weeks.  Pre-Diabetes Joe Romero will continue to work on weight loss, exercise, and decreasing simple carbohydrates in his diet to help decrease the risk of diabetes.He was informed that eating too many simple carbohydrates or too many calories at one sitting increases the likelihood of GI side effects. Zakk deferred metformin for now and agreed to continue his diet and exercise. We will recheck his labs in 3 months. De agreed to follow up with us as directed to monitor his progress.  Diabetes risk counseling Joe Romero was given extended (30 minutes) diabetes prevention counseling today. He is 53 y.o. male and has risk factors for diabetes including pre-diabetes and obesity. We discussed intensive lifestyle modifications today with an emphasis on weight loss as well as increasing exercise and decreasing simple carbohydrates in his diet.  Hyperlipidemia Joe Romero was informed of the American Heart Association Guidelines emphasizing intensive lifestyle modifications as the  first line treatment for hyperlipidemia. We discussed many lifestyle modifications today in depth, and Nickolus will continue to work on decreasing saturated fats such as fatty red meat, butter and many fried foods. He will also increase vegetables and lean protein in his diet and continue to work on exercise and weight loss efforts. Egidio agrees to continue with diet and exercise. We  will recheck labs in 3 months. We may need to start a statin if there is no improvement. Jylan agrees to follow up as directed.  Elevated Liver Function Tests Kary will continue diet and exercise and we will recheck labs in 3 months. He will let us know if there is an increase in abdominal pain or jaundice.  Obesity Virgie is currently in the action stage of change. As such, his goal is to continue with weight loss efforts. He has agreed to follow the Category 4 plan Faraji has been instructed to work up to a goal of 150 minutes of combined cardio and strengthening exercise per week for weight loss and overall health benefits. We discussed the following Behavioral Modification Strategies today: increasing lean protein intake, decreasing simple carbohydrates, no skipping meals, and work on meal planning and easy cooking plans.  Davide has agreed to follow up with our clinic in 2 weeks. He was informed of the importance of frequent follow up visits to maximize his success with intensive lifestyle modifications for his multiple health conditions.  ALLERGIES: No Known Allergies  MEDICATIONS: Current Outpatient Medications on File Prior to Visit  Medication Sig Dispense Refill  . aspirin EC 81 MG tablet Take 81 mg by mouth daily.    . chlorthalidone (HYGROTON) 25 MG tablet TAKE 1 TABLET BY MOUTH ONCE DAILY 90 tablet 0  . gabapentin (NEURONTIN) 300 MG capsule Take 1 capsule (300 mg total) by mouth 2 (two) times daily. 180 capsule 1  . levothyroxine (SYNTHROID, LEVOTHROID) 112 MCG tablet Take 1 tablet (112 mcg total) by mouth daily. 90 tablet 3  . lisinopril (PRINIVIL,ZESTRIL) 10 MG tablet Take 1 tablet (10 mg total) by mouth daily. 90 tablet 1   No current facility-administered medications on file prior to visit.     PAST MEDICAL HISTORY: Past Medical History:  Diagnosis Date  . Anxiety   . Back pain   . Dyspnea   . GERD (gastroesophageal reflux disease)   . Hyperlipidemia   .  Hypertension   . Joint pain   . Leg edema   . Leg pain   . Pain in both feet   . Sleep apnea   . Swallowing difficulty   . Thyroid disease     PAST SURGICAL HISTORY: Past Surgical History:  Procedure Laterality Date  . FINGER SURGERY Right    torn ligament    SOCIAL HISTORY: Social History   Tobacco Use  . Smoking status: Former Smoker    Packs/day: 1.00    Years: 20.00    Pack years: 20.00    Last attempt to quit: 02/16/2007    Years since quitting: 11.6  . Smokeless tobacco: Never Used  Substance Use Topics  . Alcohol use: No  . Drug use: No    FAMILY HISTORY: Family History  Problem Relation Age of Onset  . Depression Mother   . Anxiety disorder Mother   . Diabetes Father   . Hypertension Father     ROS: Review of Systems  Constitutional: Positive for malaise/fatigue and weight loss.  Gastrointestinal:       Negative for jaundice.  Genitourinary:       Negative for polyuria.  Endo/Heme/Allergies: Negative for polydipsia.       Positive for polyphagia.    PHYSICAL EXAM: Blood pressure 108/70, pulse 87, height 6' (1.829 m), weight (!) 328 lb (148.8 kg), SpO2 98 %. Body mass index is 44.48 kg/m. Physical Exam Vitals signs reviewed.  Constitutional:      Appearance: Normal appearance. He is obese.  Cardiovascular:     Rate and Rhythm: Normal rate.  Pulmonary:     Effort: Pulmonary effort is normal.  Musculoskeletal: Normal range of motion.  Skin:    General: Skin is warm and dry.  Neurological:     Mental Status: He is alert and oriented to person, place, and time.  Psychiatric:        Mood and Affect: Mood normal.        Behavior: Behavior normal.     RECENT LABS AND TESTS: BMET    Component Value Date/Time   NA 134 09/20/2018 1310   K 4.2 09/20/2018 1310   CL 92 (L) 09/20/2018 1310   CO2 21 09/20/2018 1310   GLUCOSE 101 (H) 09/20/2018 1310   BUN 20 09/20/2018 1310   CREATININE 1.18 09/20/2018 1310   CALCIUM 9.4 09/20/2018 1310    GFRNONAA 71 09/20/2018 1310   GFRAA 82 09/20/2018 1310   Lab Results  Component Value Date   HGBA1C 5.5 09/20/2018   HGBA1C 5.1 02/15/2017   Lab Results  Component Value Date   INSULIN 23.7 09/20/2018   CBC    Component Value Date/Time   WBC 11.6 (H) 09/20/2018 1310   RBC 4.69 09/20/2018 1310   HGB 14.3 09/20/2018 1310   HCT 42.2 09/20/2018 1310   MCV 90 09/20/2018 1310   MCH 30.5 09/20/2018 1310   MCHC 33.9 09/20/2018 1310   RDW 12.7 09/20/2018 1310   LYMPHSABS 2.2 09/20/2018 1310   EOSABS 0.1 09/20/2018 1310   BASOSABS 0.1 09/20/2018 1310   Iron/TIBC/Ferritin/ %Sat No results found for: IRON, TIBC, FERRITIN, IRONPCTSAT Lipid Panel     Component Value Date/Time   CHOL 251 (H) 09/20/2018 1310   TRIG 179 (H) 09/20/2018 1310   HDL 37 (L) 09/20/2018 1310   CHOLHDL 5.2 (H) 02/15/2017 1003   LDLCALC 178 (H) 09/20/2018 1310   Hepatic Function Panel     Component Value Date/Time   PROT 6.9 09/20/2018 1310   ALBUMIN 4.6 09/20/2018 1310   AST 29 09/20/2018 1310   ALT 52 (H) 09/20/2018 1310   ALKPHOS 64 09/20/2018 1310   BILITOT 0.4 09/20/2018 1310      Component Value Date/Time   TSH 5.400 (H) 09/20/2018 1310   TSH 3.770 03/17/2018 0955   TSH 2.220 04/19/2017 1041   Results for ALTAIR, REES (MRN [id number]) as of 10/05/2018 11:24  Ref. Range 09/20/2018 13:10  Vitamin D, 25-Hydroxy Latest Ref Range: 30.0 - 100.0 ng/mL 15.2 (L)   OBESITY BEHAVIORAL INTERVENTION VISIT  Today's visit was # 2   Starting weight: 340 lbs Starting date: 09/20/18 Today's weight : Weight: (!) 328 lb (148.8 kg)  Today's date: 10/04/2018 Total lbs lost to date: 12  ASK: We discussed the diagnosis of obesity with Roetta Sessions today and Symeon agreed to give [name] permission to discuss obesity behavioral modification therapy today.  ASSESS: Aahil has the diagnosis of obesity and his BMI today is 44.4. Burnette is in the action stage of change.   ADVISE: Shakim was educated on the multiple health  risks of  obesity as well as the benefit of weight loss to improve his health. He was advised of the need for long term treatment and the importance of lifestyle modifications to improve his current health and to decrease his risk of future health problems.  AGREE: Multiple dietary modification options and treatment options were discussed and Rutger agreed to follow the recommendations documented in the above note.  ARRANGE: Joe Romero was educated on the importance of frequent visits to treat obesity as outlined per CMS and USPSTF guidelines and agreed to schedule his next follow up appointment today.  I, Kirke Corinara Soares, am acting as transcriptionist for Wilder Gladearen D. Oree Hislop, MD  I have reviewed the above documentation for accuracy and completeness, and I agree with the above. -Quillian Quincearen Vinnie Bobst, MD

## 2018-10-10 DIAGNOSIS — R51 Headache: Secondary | ICD-10-CM | POA: Diagnosis not present

## 2018-10-10 DIAGNOSIS — E039 Hypothyroidism, unspecified: Secondary | ICD-10-CM | POA: Diagnosis not present

## 2018-10-10 DIAGNOSIS — J102 Influenza due to other identified influenza virus with gastrointestinal manifestations: Secondary | ICD-10-CM | POA: Diagnosis not present

## 2018-10-10 DIAGNOSIS — R05 Cough: Secondary | ICD-10-CM | POA: Diagnosis not present

## 2018-10-10 DIAGNOSIS — R509 Fever, unspecified: Secondary | ICD-10-CM | POA: Diagnosis not present

## 2018-10-10 DIAGNOSIS — Z20828 Contact with and (suspected) exposure to other viral communicable diseases: Secondary | ICD-10-CM | POA: Diagnosis not present

## 2018-10-10 DIAGNOSIS — I10 Essential (primary) hypertension: Secondary | ICD-10-CM | POA: Diagnosis not present

## 2018-10-18 ENCOUNTER — Ambulatory Visit (INDEPENDENT_AMBULATORY_CARE_PROVIDER_SITE_OTHER): Payer: BLUE CROSS/BLUE SHIELD | Admitting: Physician Assistant

## 2018-10-18 ENCOUNTER — Encounter (INDEPENDENT_AMBULATORY_CARE_PROVIDER_SITE_OTHER): Payer: Self-pay | Admitting: Physician Assistant

## 2018-10-18 VITALS — BP 108/72 | HR 78 | Temp 98.0°F | Ht 72.0 in | Wt 320.0 lb

## 2018-10-18 DIAGNOSIS — E559 Vitamin D deficiency, unspecified: Secondary | ICD-10-CM

## 2018-10-18 DIAGNOSIS — Z6841 Body Mass Index (BMI) 40.0 and over, adult: Secondary | ICD-10-CM | POA: Diagnosis not present

## 2018-10-19 NOTE — Progress Notes (Signed)
Office: [phone]  /  Fax: [phone]   HPI:   Chief Complaint: OBESITY Joe Romero is here to discuss his progress with his obesity treatment plan. He is on the Category 4 plan and is following his eating plan approximately 45% of the time. He states he is exercising 0 minutes 0 times per week. Arion did Romero with weight loss. He reports that he was sick with the flu but able to get back on the plan. His hunger is controlled.  His weight is (!) 320 lb (145.2 kg) today and has had a weight loss of 8 pounds over a period of 2 weeks since his last visit. He has lost 20 lbs since starting treatment with Joe Romero.  Vitamin D deficiency Joe Romero has a diagnosis of Vitamin D deficiency. He is currently taking prescription Vit D and denies nausea, vomiting or muscle weakness.  ASSESSMENT AND PLAN:  Vitamin D deficiency  Class 3 severe obesity with serious comorbidity and body mass index (BMI) of 40.0 to 44.9 in adult, unspecified obesity type (HCC)  PLAN:  Vitamin D Deficiency Joe Romero was informed that low Vitamin D levels contributes to fatigue and are associated with obesity, breast, and colon cancer. He agrees to continue to take prescription Vit D @ 50,000 IU every week and will follow-up for routine testing of Vitamin D, at least 2-3 times per year. He was informed of the risk of over-replacement of Vitamin D and agrees to not increase his dose unless he discusses this with Joe Romero first. Joe Romero agrees to follow-up with our clinic in 2 weeks.  I spent > than 50% of the 15 minute visit on counseling as documented in the note.  Obesity Joe Romero is currently in the action stage of change. As such, his goal is to continue with weight loss efforts. He has agreed to follow the Category 4 plan. Joe Romero has been instructed to work up to a goal of 150 minutes of combined cardio and strengthening exercise per week for weight loss and overall health benefits. We discussed the following Behavioral Modification  Stratagies today: work on meal planning and easy cooking plans and ways to avoid boredom eating.  Joe Romero has agreed to follow up with our clinic in 2 weeks. He was informed of the importance of frequent follow up visits to maximize his success with intensive lifestyle modifications for his multiple health conditions.  ALLERGIES: No Known Allergies  MEDICATIONS: Current Outpatient Medications on File Prior to Visit  Medication Sig Dispense Refill  . aspirin EC 81 MG tablet Take 81 mg by mouth daily.    . chlorthalidone (HYGROTON) 25 MG tablet TAKE 1 TABLET BY MOUTH ONCE DAILY 90 tablet 0  . gabapentin (NEURONTIN) 300 MG capsule Take 1 capsule (300 mg total) by mouth 2 (two) times daily. 180 capsule 1  . levothyroxine (SYNTHROID, LEVOTHROID) 112 MCG tablet Take 1 tablet (112 mcg total) by mouth daily. 90 tablet 3  . lisinopril (PRINIVIL,ZESTRIL) 10 MG tablet Take 1 tablet (10 mg total) by mouth daily. 90 tablet 1  . Vitamin D, Ergocalciferol, (DRISDOL) 1.25 MG (50000 UT) CAPS capsule Take 1 capsule (50,000 Units total) by mouth every 7 (seven) days. 4 capsule 0   No current facility-administered medications on file prior to visit.     PAST MEDICAL HISTORY: Past Medical History:  Diagnosis Date  . Anxiety   . Back pain   . Dyspnea   . GERD (gastroesophageal reflux disease)   . Hyperlipidemia   . Hypertension   .  Joint pain   . Leg edema   . Leg pain   . Pain in both feet   . Sleep apnea   . Swallowing difficulty   . Thyroid disease     PAST SURGICAL HISTORY: Past Surgical History:  Procedure Laterality Date  . FINGER SURGERY Right    torn ligament    SOCIAL HISTORY: Social History   Tobacco Use  . Smoking status: Former Smoker    Packs/day: 1.00    Years: 20.00    Pack years: 20.00    Last attempt to quit: 02/16/2007    Years since quitting: 11.6  . Smokeless tobacco: Never Used  Substance Use Topics  . Alcohol use: No  . Drug use: No    FAMILY HISTORY: Family  History  Problem Relation Age of Onset  . Depression Mother   . Anxiety disorder Mother   . Diabetes Father   . Hypertension Father    ROS: Review of Systems  Constitutional: Positive for weight loss.  Gastrointestinal: Negative for nausea and vomiting.  Musculoskeletal:       Negative for muscle weakness.   PHYSICAL EXAM: Blood pressure 108/72, pulse 78, temperature 98 F (36.7 C), temperature source Oral, height 6' (1.829 m), weight (!) 320 lb (145.2 kg), SpO2 96 %. Body mass index is 43.4 kg/m. Physical Exam Vitals signs reviewed.  Constitutional:      Appearance: Normal appearance. He is obese.  Cardiovascular:     Rate and Rhythm: Normal rate.     Pulses: Normal pulses.  Pulmonary:     Effort: Pulmonary effort is normal.     Breath sounds: Normal breath sounds.  Musculoskeletal: Normal range of motion.  Skin:    General: Skin is warm and dry.  Neurological:     Mental Status: He is alert and oriented to person, place, and time.  Psychiatric:        Behavior: Behavior normal.   RECENT LABS AND TESTS: BMET    Component Value Date/Time   NA 134 09/20/2018 1310   K 4.2 09/20/2018 1310   CL 92 (L) 09/20/2018 1310   CO2 21 09/20/2018 1310   GLUCOSE 101 (H) 09/20/2018 1310   BUN 20 09/20/2018 1310   CREATININE 1.18 09/20/2018 1310   CALCIUM 9.4 09/20/2018 1310   GFRNONAA 71 09/20/2018 1310   GFRAA 82 09/20/2018 1310   Lab Results  Component Value Date   HGBA1C 5.5 09/20/2018   HGBA1C 5.1 02/15/2017   Lab Results  Component Value Date   INSULIN 23.7 09/20/2018   CBC    Component Value Date/Time   WBC 11.6 (H) 09/20/2018 1310   RBC 4.69 09/20/2018 1310   HGB 14.3 09/20/2018 1310   HCT 42.2 09/20/2018 1310   MCV 90 09/20/2018 1310   MCH 30.5 09/20/2018 1310   MCHC 33.9 09/20/2018 1310   RDW 12.7 09/20/2018 1310   LYMPHSABS 2.2 09/20/2018 1310   EOSABS 0.1 09/20/2018 1310   BASOSABS 0.1 09/20/2018 1310   Iron/TIBC/Ferritin/ %Sat No results found  for: IRON, TIBC, FERRITIN, IRONPCTSAT Lipid Panel     Component Value Date/Time   CHOL 251 (H) 09/20/2018 1310   TRIG 179 (H) 09/20/2018 1310   HDL 37 (L) 09/20/2018 1310   CHOLHDL 5.2 (H) 02/15/2017 1003   LDLCALC 178 (H) 09/20/2018 1310   Hepatic Function Panel     Component Value Date/Time   PROT 6.9 09/20/2018 1310   ALBUMIN 4.6 09/20/2018 1310   AST 29 09/20/2018 1310  ALT 52 (H) 09/20/2018 1310   ALKPHOS 64 09/20/2018 1310   BILITOT 0.4 09/20/2018 1310      Component Value Date/Time   TSH 5.400 (H) 09/20/2018 1310   TSH 3.770 03/17/2018 0955   TSH 2.220 04/19/2017 1041   Results for SHANTANU, BENGOCHEA (MRN [id number]) as of 10/19/2018 08:22  Ref. Range 09/20/2018 13:10  Vitamin D, 25-Hydroxy Latest Ref Range: 30.0 - 100.0 ng/mL 15.2 (L)   OBESITY BEHAVIORAL INTERVENTION VISIT  Today's visit was #3  Starting weight: 340 lbs Starting date: 09/20/2018 Today's weight: 320 lbs Today's date: 10/18/2018 Total lbs lost to date: 20  ASK: We discussed the diagnosis of obesity with Roetta Sessions today and Gabrial agreed to give [name] permission to discuss obesity behavioral modification therapy today.  ASSESS: [place] has the diagnosis of obesity and his BMI today is @ 43.40. Jissie is in the action stage of change.   ADVISE: Lucien was educated on the multiple health risks of obesity as Romero as the benefit of weight loss to improve his health. He was advised of the need for long term treatment and the importance of lifestyle modifications to improve his current health and to decrease his risk of future health problems.  AGREE: Multiple dietary modification options and treatment options were discussed and  Saadiq agreed to follow the recommendations documented in the above note.  ARRANGE: Erubey was educated on the importance of frequent visits to treat obesity as outlined per CMS and USPSTF guidelines and agreed to schedule his next follow up appointment today.  Fernanda Drum, am acting  as transcriptionist for Alois Cliche, PA-C I, Alois Cliche, PA-C have reviewed above note and agree with its content

## 2018-10-29 DIAGNOSIS — G4733 Obstructive sleep apnea (adult) (pediatric): Secondary | ICD-10-CM | POA: Diagnosis not present

## 2018-11-07 ENCOUNTER — Ambulatory Visit (INDEPENDENT_AMBULATORY_CARE_PROVIDER_SITE_OTHER): Payer: BLUE CROSS/BLUE SHIELD | Admitting: Family Medicine

## 2018-11-07 ENCOUNTER — Encounter (INDEPENDENT_AMBULATORY_CARE_PROVIDER_SITE_OTHER): Payer: Self-pay | Admitting: Family Medicine

## 2018-11-07 VITALS — BP 111/71 | HR 97 | Temp 97.8°F | Ht 72.0 in | Wt 323.0 lb

## 2018-11-07 DIAGNOSIS — E559 Vitamin D deficiency, unspecified: Secondary | ICD-10-CM

## 2018-11-07 DIAGNOSIS — Z9189 Other specified personal risk factors, not elsewhere classified: Secondary | ICD-10-CM | POA: Diagnosis not present

## 2018-11-07 DIAGNOSIS — I10 Essential (primary) hypertension: Secondary | ICD-10-CM | POA: Diagnosis not present

## 2018-11-07 DIAGNOSIS — E7849 Other hyperlipidemia: Secondary | ICD-10-CM

## 2018-11-07 DIAGNOSIS — Z6841 Body Mass Index (BMI) 40.0 and over, adult: Secondary | ICD-10-CM

## 2018-11-07 MED ORDER — VITAMIN D (ERGOCALCIFEROL) 1.25 MG (50000 UNIT) PO CAPS: 50000.0000 [IU] | ORAL_CAPSULE | ORAL | 0 refills | Status: DC

## 2018-11-07 NOTE — Progress Notes (Signed)
Office: [phone]  /  Fax: [phone]   HPI:   Chief Complaint: OBESITY Joe Romero is here to discuss his progress with his obesity treatment plan. He is on the Category 4 plan and is following his eating plan approximately 85 % of the time. He states he is exercising 0 minutes 0 times per week. [name] reports feeling slightly guilty with weight gain. He has decreased water intake and increased diet drinks. He has made slightly more indulgent choices secondary to how successful he has been. He denies hunger.  His weight is (!) 323 lb (146.5 kg) today and has gained 3 pounds since his last visit. He has lost 17 lbs since starting treatment with [name].  Vitamin D Deficiency Joe Romero has a diagnosis of vitamin D deficiency. He is currently taking prescription Vit D. He notes fatigue and denies nausea, vomiting or muscle weakness.  At risk for osteopenia and osteoporosis Rett is at higher risk of osteopenia and osteoporosis due to vitamin D deficiency.   Hypertension Joe Romero is a 53 y.o. male with hypertension. Joe Romero's blood pressure is well controlled. He denies chest pain, chest pressure, or headaches. He is working on weight loss to help control his blood pressure with the goal of decreasing his risk of heart attack and stroke.   Hyperlipidemia Joe Romero has hyperlipidemia and has been trying to improve his cholesterol levels with intensive lifestyle modification including a low saturated fat diet, exercise and weight loss. His LDL is of 178, per ASCVD guidelines suggest moderate intensity statin. He denies any chest pain, claudication or myalgias.  ASSESSMENT AND PLAN:  Vitamin D deficiency - Plan: Vitamin D, Ergocalciferol, (DRISDOL) 1.25 MG (50000 UT) CAPS capsule  Essential hypertension  Other hyperlipidemia  At risk for osteoporosis  Class 3 severe obesity with serious comorbidity and body mass index (BMI) of 40.0 to 44.9 in adult, unspecified obesity type (HCC)  PLAN:  Vitamin D  Deficiency Joe Romero was informed that low vitamin D levels contributes to fatigue and are associated with obesity, breast, and colon cancer. Joe Romero agrees to continue taking prescription Vit D ,000 IU every week #4 and we will refill for 1 month. He will follow up for routine testing of vitamin D, at least 2-3 times per year. He was informed of the risk of over-replacement of vitamin D and agrees to not increase his dose unless he discusses this with [name] first. Abdulloh agrees to follow up with our clinic in 2 weeks.  At risk for osteopenia and osteoporosis Joe Romero was given extended (15 minutes) osteoporosis prevention counseling today. Joe Romero is at risk for osteopenia and osteoporsis due to his vitamin D deficiency. He was encouraged to take his vitamin D and follow his higher calcium diet and increase strengthening exercise to help strengthen his bones and decrease his risk of osteopenia and osteoporosis.  Hypertension We discussed sodium restriction, working on healthy weight loss, and a regular exercise program as the means to achieve improved blood pressure control. Joe Romero agreed with this plan and agreed to follow up as directed. We will continue to monitor his blood pressure as well as his progress with the above lifestyle modifications. Joe Romero agrees to continue his current medications and will watch for signs of hypotension as he continues his lifestyle modifications. Joe Romero agrees to follow up with our clinic in 2 weeks.  Hyperlipidemia Joe Romero was informed of the American Heart Association Guidelines emphasizing intensive lifestyle modifications as the first line treatment for hyperlipidemia. We discussed many lifestyle modifications today in  depth, and Joe Romero will continue to work on decreasing saturated fats such as fatty red meat, butter and many fried foods. He will also increase vegetables and lean protein in his diet and continue to work on exercise and weight loss efforts. We will follow up on FLP in  2 months. Joe Romero agrees to follow up with our clinic in 2 weeks.  Obesity Joe Romero is currently in the action stage of change. As such, his goal is to continue with weight loss efforts He has agreed to follow the Category 4 plan Joe Romero has been instructed to work up to a goal of 150 minutes of combined cardio and strengthening exercise per week for weight loss and overall health benefits. We discussed the following Behavioral Modification Strategies today: increasing lean protein intake, increasing vegetables, work on meal planning and easy cooking plans, celebration eating strategies, and planning for success    Joe Romero has agreed to follow up with our clinic in 2 weeks. He was informed of the importance of frequent follow up visits to maximize his success with intensive lifestyle modifications for his multiple health conditions.  ALLERGIES: No Known Allergies  MEDICATIONS: Current Outpatient Medications on File Prior to Visit  Medication Sig Dispense Refill  . aspirin EC 81 MG tablet Take 81 mg by mouth daily.    . chlorthalidone (HYGROTON) 25 MG tablet TAKE 1 TABLET BY MOUTH ONCE DAILY 90 tablet 0  . gabapentin (NEURONTIN) 300 MG capsule Take 1 capsule (300 mg total) by mouth 2 (two) times daily. 180 capsule 1  . levothyroxine (SYNTHROID, LEVOTHROID) 112 MCG tablet Take 1 tablet (112 mcg total) by mouth daily. 90 tablet 3  . lisinopril (PRINIVIL,ZESTRIL) 10 MG tablet Take 1 tablet (10 mg total) by mouth daily. 90 tablet 1   No current facility-administered medications on file prior to visit.     PAST MEDICAL HISTORY: Past Medical History:  Diagnosis Date  . Anxiety   . Back pain   . Dyspnea   . GERD (gastroesophageal reflux disease)   . Hyperlipidemia   . Hypertension   . Joint pain   . Leg edema   . Leg pain   . Pain in both feet   . Sleep apnea   . Swallowing difficulty   . Thyroid disease     PAST SURGICAL HISTORY: Past Surgical History:  Procedure Laterality Date  .  FINGER SURGERY Right    torn ligament    SOCIAL HISTORY: Social History   Tobacco Use  . Smoking status: Former Smoker    Packs/day: 1.00    Years: 20.00    Pack years: 20.00    Last attempt to quit: 02/16/2007    Years since quitting: 11.7  . Smokeless tobacco: Never Used  Substance Use Topics  . Alcohol use: No  . Drug use: No    FAMILY HISTORY: Family History  Problem Relation Age of Onset  . Depression Mother   . Anxiety disorder Mother   . Diabetes Father   . Hypertension Father     ROS: Review of Systems  Constitutional: Positive for malaise/fatigue. Negative for weight loss.  Cardiovascular: Negative for chest pain and claudication.       Negative chest pressure  Gastrointestinal: Negative for nausea and vomiting.  Musculoskeletal: Negative for myalgias.       Negative muscle weakness  Neurological: Negative for headaches.    PHYSICAL EXAM: Blood pressure 111/71, pulse 97, temperature 97.8 F (36.6 C), temperature source Oral, height 6' (1.829 m), weight Marland Kitchen)  323 lb (146.5 kg), SpO2 96 %. Body mass index is 43.81 kg/m. Physical Exam Vitals signs reviewed.  Constitutional:      Appearance: Normal appearance. He is obese.  Cardiovascular:     Rate and Rhythm: Normal rate.     Pulses: Normal pulses.  Pulmonary:     Effort: Pulmonary effort is normal.     Breath sounds: Normal breath sounds.  Musculoskeletal: Normal range of motion.  Skin:    General: Skin is warm and dry.  Neurological:     Mental Status: He is alert and oriented to person, place, and time.  Psychiatric:        Mood and Affect: Mood normal.        Behavior: Behavior normal.     RECENT LABS AND TESTS: BMET    Component Value Date/Time   NA 134 09/20/2018 1310   K 4.2 09/20/2018 1310   CL 92 (L) 09/20/2018 1310   CO2 21 09/20/2018 1310   GLUCOSE 101 (H) 09/20/2018 1310   BUN 20 09/20/2018 1310   CREATININE 1.18 09/20/2018 1310   CALCIUM 9.4 09/20/2018 1310   GFRNONAA 71  09/20/2018 1310   GFRAA 82 09/20/2018 1310   Lab Results  Component Value Date   HGBA1C 5.5 09/20/2018   HGBA1C 5.1 02/15/2017   Lab Results  Component Value Date   INSULIN 23.7 09/20/2018   CBC    Component Value Date/Time   WBC 11.6 (H) 09/20/2018 1310   RBC 4.69 09/20/2018 1310   HGB 14.3 09/20/2018 1310   HCT 42.2 09/20/2018 1310   MCV 90 09/20/2018 1310   MCH 30.5 09/20/2018 1310   MCHC 33.9 09/20/2018 1310   RDW 12.7 09/20/2018 1310   LYMPHSABS 2.2 09/20/2018 1310   EOSABS 0.1 09/20/2018 1310   BASOSABS 0.1 09/20/2018 1310   Iron/TIBC/Ferritin/ %Sat No results found for: IRON, TIBC, FERRITIN, IRONPCTSAT Lipid Panel     Component Value Date/Time   CHOL 251 (H) 09/20/2018 1310   TRIG 179 (H) 09/20/2018 1310   HDL 37 (L) 09/20/2018 1310   CHOLHDL 5.2 (H) 02/15/2017 1003   LDLCALC 178 (H) 09/20/2018 1310   Hepatic Function Panel     Component Value Date/Time   PROT 6.9 09/20/2018 1310   ALBUMIN 4.6 09/20/2018 1310   AST 29 09/20/2018 1310   ALT 52 (H) 09/20/2018 1310   ALKPHOS 64 09/20/2018 1310   BILITOT 0.4 09/20/2018 1310      Component Value Date/Time   TSH 5.400 (H) 09/20/2018 1310   TSH 3.770 03/17/2018 0955   TSH 2.220 04/19/2017 1041      OBESITY BEHAVIORAL INTERVENTION VISIT  Today's visit was # 4   Starting weight: 340 lbs Starting date: 09/20/2018 Today's weight : 323 lbs  Today's date: 11/07/2018 Total lbs lost to date: 17    11/07/2018  Height 6' (1.829 m)  Weight 323 lb (146.5 kg) (A)  BMI (Calculated) 43.8  BLOOD PRESSURE - SYSTOLIC 111  BLOOD PRESSURE - DIASTOLIC 71   Body Fat % 39.3 %  Total Body Water (lbs) 141.8 lbs     ASK: We discussed the diagnosis of obesity with Roetta Sessionsicky Gable today and Nickolaos agreed to give us permission to discuss obesity behavioral modification therapy today.  ASSESS: [name] has the diagnosis of obesity and his BMI today is 8443.8 Alva is in the action stage of change   ADVISE: [name] was educated  on the multiple health risks of obesity as well as the benefit  of weight loss to improve his health. He was advised of the need for long term treatment and the importance of lifestyle modifications to improve his current health and to decrease his risk of future health problems.  AGREE: Multiple dietary modification options and treatment options were discussed and  Espn agreed to follow the recommendations documented in the above note.  ARRANGE: Keyton was educated on the importance of frequent visits to treat obesity as outlined per CMS and USPSTF guidelines and agreed to schedule his next follow up appointment today.  I, Burt Knack, am acting as transcriptionist for Debbra Riding, MD  I have reviewed the above documentation for accuracy and completeness, and I agree with the above. - Debbra Riding, MD

## 2018-11-21 ENCOUNTER — Encounter (INDEPENDENT_AMBULATORY_CARE_PROVIDER_SITE_OTHER): Payer: Self-pay

## 2018-11-22 ENCOUNTER — Encounter: Payer: Self-pay | Admitting: Family Medicine

## 2018-11-22 ENCOUNTER — Ambulatory Visit (INDEPENDENT_AMBULATORY_CARE_PROVIDER_SITE_OTHER): Payer: BLUE CROSS/BLUE SHIELD | Admitting: Family Medicine

## 2018-11-22 ENCOUNTER — Other Ambulatory Visit: Payer: Self-pay

## 2018-11-22 VITALS — BP 115/74 | HR 97 | Temp 98.4°F | Ht 72.0 in | Wt 308.0 lb

## 2018-11-22 DIAGNOSIS — K8020 Calculus of gallbladder without cholecystitis without obstruction: Secondary | ICD-10-CM

## 2018-11-22 DIAGNOSIS — R1011 Right upper quadrant pain: Secondary | ICD-10-CM

## 2018-11-22 DIAGNOSIS — E782 Mixed hyperlipidemia: Secondary | ICD-10-CM

## 2018-11-22 DIAGNOSIS — G629 Polyneuropathy, unspecified: Secondary | ICD-10-CM | POA: Insufficient documentation

## 2018-11-22 MED ORDER — GABAPENTIN 300 MG PO CAPS: 300.0000 mg | ORAL_CAPSULE | Freq: Three times a day (TID) | ORAL | 3 refills | Status: DC

## 2018-11-22 NOTE — Progress Notes (Signed)
Subjective:  Patient ID: Joe Romero, male    DOB: [birth year], 53 y.o.   MRN: [id number]  Chief Complaint:  RUQ pain (ongoing for several months but is worsening) and Foot and leg pain (discuss Gabapentin)   HPI: Joe Romero is a 53 y.o. male presenting on 11/22/2018 for RUQ pain (ongoing for several months but is worsening) and Foot and leg pain (discuss Gabapentin)   1. RUQ pain  Pt presents today with complaints of RUQ / right flank pain. Pt states this started several moths ago and is not getting better. Pt states the pain is aggravated when he sleeps on his right side. Pt states the pain is aching to sharp, 5/10 at worst. States the pain is worse with palpation. He denies fever, chills, abnormal weight loss, fatigue, changes in bowel habits, nausea or vomiting. No chest pain or shortness of breath.    2. Neuropathy  Pt states he has increasing pain in his feet and lower legs. States he is taking the gabapentin as prescribed. States it is beneficial at times, but at times it is not. States he has days when the pain is a 10/10. States he has an appointment with podiatry soon. He states this affects his daily activities due to the pain.    3. Mixed hyperlipidemia  States his cholesterol remains elevated despite weight loss. States he would like to start a statin if needed. Will check labs today.      Relevant past medical, surgical, family, and social history reviewed and updated as indicated.  Allergies and medications reviewed and updated.   Past Medical History:  Diagnosis Date  . Anxiety   . Back pain   . Dyspnea   . GERD (gastroesophageal reflux disease)   . Hyperlipidemia   . Hypertension   . Joint pain   . Leg edema   . Leg pain   . Pain in both feet   . Sleep apnea   . Swallowing difficulty   . Thyroid disease     Past Surgical History:  Procedure Laterality Date  . FINGER SURGERY Right    torn ligament    Social History   Socioeconomic History  .  Marital status: Married    Spouse name: Maggie Dworkin  . Number of children: Not on file  . Years of education: Not on file  . Highest education level: Not on file  Occupational History  . Occupation: Designer, television/film set  Social Needs  . Financial resource strain: Not on file  . Food insecurity:    Worry: Not on file    Inability: Not on file  . Transportation needs:    Medical: Not on file    Non-medical: Not on file  Tobacco Use  . Smoking status: Former Smoker    Packs/day: 1.00    Years: 20.00    Pack years: 20.00    Last attempt to quit: 02/16/2007    Years since quitting: 11.7  . Smokeless tobacco: Never Used  Substance and Sexual Activity  . Alcohol use: No  . Drug use: No  . Sexual activity: Not on file  Lifestyle  . Physical activity:    Days per week: Not on file    Minutes per session: Not on file  . Stress: Not on file  Relationships  . Social connections:    Talks on phone: Not on file    Gets together: Not on file    Attends religious service: Not on file  Active member of club or organization: Not on file    Attends meetings of clubs or organizations: Not on file    Relationship status: Not on file  . Intimate partner violence:    Fear of current or ex partner: Not on file    Emotionally abused: Not on file    Physically abused: Not on file    Forced sexual activity: Not on file  Other Topics Concern  . Not on file  Social History Narrative  . Not on file    Outpatient Encounter Medications as of 11/22/2018  Medication Sig  . aspirin EC 81 MG tablet Take 81 mg by mouth daily.  . chlorthalidone (HYGROTON) 25 MG tablet TAKE 1 TABLET BY MOUTH ONCE DAILY  . levothyroxine (SYNTHROID, LEVOTHROID) 112 MCG tablet Take 1 tablet (112 mcg total) by mouth daily.  Marland Kitchen lisinopril (PRINIVIL,ZESTRIL) 10 MG tablet Take 1 tablet (10 mg total) by mouth daily.  . Vitamin D, Ergocalciferol, (DRISDOL) 1.25 MG (50000 UT) CAPS capsule Take 1 capsule (50,000 Units  total) by mouth every 7 (seven) days.  . [DISCONTINUED] gabapentin (NEURONTIN) 300 MG capsule Take 1 capsule (300 mg total) by mouth 2 (two) times daily.  Marland Kitchen gabapentin (NEURONTIN) 300 MG capsule Take 1 capsule (300 mg total) by mouth 3 (three) times daily.   No facility-administered encounter medications on file as of 11/22/2018.     No Known Allergies  Review of Systems  Constitutional: Negative for activity change, appetite change, fatigue, fever and unexpected weight change.  Respiratory: Negative for cough and shortness of breath.   Cardiovascular: Negative for chest pain, palpitations and leg swelling.  Gastrointestinal: Positive for abdominal pain. Negative for abdominal distention, anal bleeding, blood in stool, constipation, diarrhea, nausea, rectal pain and vomiting.  Genitourinary: Negative for decreased urine volume, difficulty urinating and hematuria.  Musculoskeletal: Positive for myalgias (feet and legs).  Neurological: Negative for weakness and headaches.  Psychiatric/Behavioral: Negative for confusion.  All other systems reviewed and are negative.       Objective:  BP 115/74   Pulse 97   Temp 98.4 F (36.9 C) (Oral)   Ht 6' (1.829 m)   Wt (!) 308 lb (139.7 kg)   BMI 41.77 kg/m    Wt Readings from Last 3 Encounters:  11/22/18 (!) 308 lb (139.7 kg)  11/07/18 (!) 323 lb (146.5 kg)  10/18/18 (!) 320 lb (145.2 kg)    Physical Exam Vitals signs and nursing note reviewed.  Constitutional:      General: He is not in acute distress.    Appearance: Normal appearance. He is well-developed and well-groomed. He is obese. He is not ill-appearing or toxic-appearing.  HENT:     Head: Normocephalic and atraumatic.     Jaw: There is normal jaw occlusion.     Nose: Nose normal.     Mouth/Throat:     Mouth: Mucous membranes are moist.     Pharynx: Oropharynx is clear. No oropharyngeal exudate or posterior oropharyngeal erythema.  Eyes:     General: Lids are normal.      Extraocular Movements: Extraocular movements intact.     Conjunctiva/sclera: Conjunctivae normal.     Pupils: Pupils are equal, round, and reactive to light.  Neck:     Musculoskeletal: Normal range of motion and neck supple.     Vascular: No carotid bruit or JVD.     Trachea: Trachea and phonation normal.  Cardiovascular:     Rate and Rhythm: Normal rate and regular  rhythm.     Pulses: Normal pulses.          Dorsalis pedis pulses are 2+ on the right side and 2+ on the left side.       Posterior tibial pulses are 2+ on the right side and 2+ on the left side.     Heart sounds: Normal heart sounds. No murmur. No friction rub. No gallop.   Pulmonary:     Effort: Pulmonary effort is normal. No respiratory distress.     Breath sounds: Normal breath sounds.  Abdominal:     General: Abdomen is protuberant. Bowel sounds are normal. There is no distension or abdominal bruit. There are no signs of injury.     Palpations: Abdomen is soft. There is no hepatomegaly or splenomegaly.     Tenderness: There is abdominal tenderness in the right upper quadrant. There is no right CVA tenderness, left CVA tenderness, guarding or rebound. Negative signs include Murphy's sign and McBurney's sign.     Hernia: No hernia is present.    Musculoskeletal:     Right lower leg: No edema.     Left lower leg: No edema.  Feet:     Right foot:     Protective Sensation: 10 sites tested. 10 sites sensed.     Left foot:     Protective Sensation: 10 sites tested. 10 sites sensed.  Skin:    General: Skin is warm.     Capillary Refill: Capillary refill takes less than 2 seconds.  Neurological:     General: No focal deficit present.     Mental Status: He is alert and oriented to person, place, and time.  Psychiatric:        Mood and Affect: Mood normal.        Behavior: Behavior normal. Behavior is cooperative.        Thought Content: Thought content normal.        Judgment: Judgment normal.     Results for  orders placed or performed in visit on 09/20/18  Vitamin B12  Result Value Ref Range   Vitamin B-12 394 232 - 1,245 pg/mL  CBC With Differential  Result Value Ref Range   WBC 11.6 (H) 3.4 - 10.8 x10E3/uL   RBC 4.69 4.14 - 5.80 x10E6/uL   Hemoglobin 14.3 13.0 - 17.7 g/dL   Hematocrit 42.2 37.5 - 51.0 %   MCV 90 79 - 97 fL   MCH 30.5 26.6 - 33.0 pg   MCHC 33.9 31.5 - 35.7 g/dL   RDW 12.7 11.6 - 15.4 %   Neutrophils 71 Not Estab. %   Lymphs 19 Not Estab. %   Monocytes 7 Not Estab. %   Eos 1 Not Estab. %   Basos 1 Not Estab. %   Neutrophils Absolute 8.3 (H) 1.4 - 7.0 x10E3/uL   Lymphocytes Absolute 2.2 0.7 - 3.1 x10E3/uL   Monocytes Absolute 0.8 0.1 - 0.9 x10E3/uL   EOS (ABSOLUTE) 0.1 0.0 - 0.4 x10E3/uL   Basophils Absolute 0.1 0.0 - 0.2 x10E3/uL   Immature Granulocytes 1 Not Estab. %   Immature Grans (Abs) 0.1 0.0 - 0.1 x10E3/uL  Comprehensive metabolic panel  Result Value Ref Range   Glucose 101 (H) 65 - 99 mg/dL   BUN 20 6 - 24 mg/dL   Creatinine, Ser 1.18 0.76 - 1.27 mg/dL   GFR calc non Af Amer 71 >59 mL/min/1.73   GFR calc Af Amer 82 >59 mL/min/1.73   BUN/Creatinine Ratio 17 9 -  20   Sodium 134 134 - 144 mmol/L   Potassium 4.2 3.5 - 5.2 mmol/L   Chloride 92 (L) 96 - 106 mmol/L   CO2 21 20 - 29 mmol/L   Calcium 9.4 8.7 - 10.2 mg/dL   Total Protein 6.9 6.0 - 8.5 g/dL   Albumin 4.6 3.5 - 5.5 g/dL   Globulin, Total 2.3 1.5 - 4.5 g/dL   Albumin/Globulin Ratio 2.0 1.2 - 2.2   Bilirubin Total 0.4 0.0 - 1.2 mg/dL   Alkaline Phosphatase 64 39 - 117 IU/L   AST 29 0 - 40 IU/L   ALT 52 (H) 0 - 44 IU/L  Folate  Result Value Ref Range   Folate 12.1 >3.0 ng/mL  Hemoglobin A1c  Result Value Ref Range   Hgb A1c MFr Bld 5.5 4.8 - 5.6 %   Est. average glucose Bld gHb Est-mCnc 111 mg/dL  Insulin, random  Result Value Ref Range   INSULIN 23.7 2.6 - 24.9 uIU/mL  Lipid Panel With LDL/HDL Ratio  Result Value Ref Range   Cholesterol, Total 251 (H) 100 - 199 mg/dL   Triglycerides  179 (H) 0 - 149 mg/dL   HDL 37 (L) >39 mg/dL   VLDL Cholesterol Cal 36 5 - 40 mg/dL   LDL Calculated 178 (H) 0 - 99 mg/dL   LDl/HDL Ratio 4.8 (H) 0.0 - 3.6 ratio  T3  Result Value Ref Range   T3, Total 114 71 - 180 ng/dL  T4, free  Result Value Ref Range   Free T4 1.36 0.82 - 1.77 ng/dL  TSH  Result Value Ref Range   TSH 5.400 (H) 0.450 - 4.500 uIU/mL  VITAMIN D 25 Hydroxy (Vit-D Deficiency, Fractures)  Result Value Ref Range   Vit D, 25-Hydroxy 15.2 (L) 30.0 - 100.0 ng/mL       Pertinent labs & imaging results that were available during my care of the patient were reviewed by me and considered in my medical decision making.  Assessment & Plan:  Neilson was seen today for ruq pain and foot and leg pain.  Diagnoses and all orders for this visit:  RUQ pain Labs and [name] pending. Report any new or worsening symptoms. Reevaluation in 2 weeks.  -     CMP14+EGFR -     Amylase -     Lipase -     CBC with Differential/Platelet -     US Abdomen Limited RUQ; Future  Neuropathy Continued pain. Will increase gabapentin to 300 mg three times daily. Has follow up with Dr. Irving Shows, keep appointment.  -     gabapentin (NEURONTIN) 300 MG capsule; Take 1 capsule (300 mg total) by mouth 3 (three) times daily.  Mixed hyperlipidemia Labs pending. May need to start statin, pt open to statin therapy if cholesterol remains elevated.  -     Lipid panel     Continue all other maintenance medications.  Follow up plan: Return in about 2 weeks (around 12/06/2018), or if symptoms worsen or fail to improve, for RUQ pain.  The above assessment and management plan was discussed with the patient. The patient verbalized understanding of and has agreed to the management plan. Patient is aware to call the clinic if symptoms persist or worsen. Patient is aware when to return to the clinic for a follow-up visit. Patient educated on when it is appropriate to go to the emergency department.   Monia Pouch,  FNP-C [facility] [phone]

## 2018-11-23 LAB — CBC WITH DIFFERENTIAL/PLATELET
Basophils Absolute: 0.1 10*3/uL (ref 0.0–0.2)
Basos: 1 %
EOS (ABSOLUTE): 0.2 10*3/uL (ref 0.0–0.4)
Eos: 1 %
Hematocrit: 37 % — ABNORMAL LOW (ref 37.5–51.0)
Hemoglobin: 12.9 g/dL — ABNORMAL LOW (ref 13.0–17.7)
Immature Grans (Abs): 0.1 10*3/uL (ref 0.0–0.1)
Immature Granulocytes: 1 %
Lymphocytes Absolute: 2.5 10*3/uL (ref 0.7–3.1)
Lymphs: 19 %
MCH: 30.1 pg (ref 26.6–33.0)
MCHC: 34.9 g/dL (ref 31.5–35.7)
MCV: 86 fL (ref 79–97)
MONOS ABS: 1 10*3/uL — AB (ref 0.1–0.9)
Monocytes: 8 %
Neutrophils Absolute: 9.4 10*3/uL — ABNORMAL HIGH (ref 1.4–7.0)
Neutrophils: 70 %
Platelets: 281 10*3/uL (ref 150–450)
RBC: 4.28 x10E6/uL (ref 4.14–5.80)
RDW: 13.3 % (ref 11.6–15.4)
WBC: 13.3 10*3/uL — ABNORMAL HIGH (ref 3.4–10.8)

## 2018-11-23 LAB — CMP14+EGFR
ALK PHOS: 62 IU/L (ref 39–117)
ALT: 29 IU/L (ref 0–44)
AST: 21 IU/L (ref 0–40)
Albumin/Globulin Ratio: 1.7 (ref 1.2–2.2)
Albumin: 4.2 g/dL (ref 3.8–4.9)
BILIRUBIN TOTAL: 0.3 mg/dL (ref 0.0–1.2)
BUN/Creatinine Ratio: 15 (ref 9–20)
BUN: 22 mg/dL (ref 6–24)
CO2: 28 mmol/L (ref 20–29)
Calcium: 9.2 mg/dL (ref 8.7–10.2)
Chloride: 95 mmol/L — ABNORMAL LOW (ref 96–106)
Creatinine, Ser: 1.44 mg/dL — ABNORMAL HIGH (ref 0.76–1.27)
GFR calc Af Amer: 65 mL/min/{1.73_m2} (ref >59)
GFR calc non Af Amer: 56 mL/min/{1.73_m2} — ABNORMAL LOW (ref >59)
Globulin, Total: 2.5 g/dL (ref 1.5–4.5)
Glucose: 86 mg/dL (ref 65–99)
Potassium: 4.4 mmol/L (ref 3.5–5.2)
Sodium: 136 mmol/L (ref 134–144)
Total Protein: 6.7 g/dL (ref 6.0–8.5)

## 2018-11-23 LAB — LIPID PANEL
CHOLESTEROL TOTAL: 210 mg/dL — AB (ref 100–199)
Chol/HDL Ratio: 6.2 ratio — ABNORMAL HIGH (ref 0.0–5.0)
HDL: 34 mg/dL — ABNORMAL LOW (ref >39)
LDL Calculated: 125 mg/dL — ABNORMAL HIGH (ref 0–99)
Triglycerides: 254 mg/dL — ABNORMAL HIGH (ref 0–149)
VLDL Cholesterol Cal: 51 mg/dL — ABNORMAL HIGH (ref 5–40)

## 2018-11-23 LAB — AMYLASE: Amylase: 23 U/L — ABNORMAL LOW (ref 31–110)

## 2018-11-23 LAB — LIPASE: Lipase: 15 U/L (ref 13–78)

## 2018-11-23 MED ORDER — ATORVASTATIN CALCIUM 20 MG PO TABS: 20.0000 mg | ORAL_TABLET | Freq: Every day | ORAL | 3 refills | Status: DC

## 2018-11-23 NOTE — Addendum Note (Signed)
Addended by: Sonny Masters on: 11/23/2018 08:47 AM   Modules accepted: Orders

## 2018-11-24 ENCOUNTER — Ambulatory Visit (INDEPENDENT_AMBULATORY_CARE_PROVIDER_SITE_OTHER): Payer: BLUE CROSS/BLUE SHIELD | Admitting: Physician Assistant

## 2018-11-25 ENCOUNTER — Ambulatory Visit (HOSPITAL_COMMUNITY)
Admission: RE | Admit: 2018-11-25 | Discharge: 2018-11-25 | Disposition: A | Discharge status: Home / Self Care | Payer: BLUE CROSS/BLUE SHIELD | Source: Ambulatory Visit | Attending: Family Medicine | Admitting: Family Medicine

## 2018-11-25 ENCOUNTER — Other Ambulatory Visit: Payer: Self-pay

## 2018-11-25 DIAGNOSIS — R1011 Right upper quadrant pain: Secondary | ICD-10-CM | POA: Diagnosis not present

## 2018-11-25 DIAGNOSIS — K8020 Calculus of gallbladder without cholecystitis without obstruction: Secondary | ICD-10-CM | POA: Diagnosis not present

## 2018-11-25 NOTE — Addendum Note (Signed)
Addended by: Sonny Masters on: 11/25/2018 11:25 AM   Modules accepted: Orders

## 2018-11-27 DIAGNOSIS — G4733 Obstructive sleep apnea (adult) (pediatric): Secondary | ICD-10-CM | POA: Diagnosis not present

## 2018-11-28 ENCOUNTER — Encounter (INDEPENDENT_AMBULATORY_CARE_PROVIDER_SITE_OTHER): Payer: Self-pay

## 2018-11-28 ENCOUNTER — Encounter (INDEPENDENT_AMBULATORY_CARE_PROVIDER_SITE_OTHER): Payer: Self-pay | Admitting: Family Medicine

## 2018-11-28 ENCOUNTER — Ambulatory Visit (INDEPENDENT_AMBULATORY_CARE_PROVIDER_SITE_OTHER): Payer: BLUE CROSS/BLUE SHIELD | Admitting: Physician Assistant

## 2018-11-28 NOTE — Telephone Encounter (Signed)
Please advise 

## 2018-12-01 ENCOUNTER — Other Ambulatory Visit: Payer: Self-pay

## 2018-12-01 ENCOUNTER — Telehealth (INDEPENDENT_AMBULATORY_CARE_PROVIDER_SITE_OTHER): Payer: BLUE CROSS/BLUE SHIELD | Admitting: Gastroenterology

## 2018-12-01 ENCOUNTER — Encounter (INDEPENDENT_AMBULATORY_CARE_PROVIDER_SITE_OTHER): Payer: Self-pay

## 2018-12-01 DIAGNOSIS — R109 Unspecified abdominal pain: Secondary | ICD-10-CM

## 2018-12-01 DIAGNOSIS — K8020 Calculus of gallbladder without cholecystitis without obstruction: Secondary | ICD-10-CM | POA: Diagnosis not present

## 2018-12-01 DIAGNOSIS — K760 Fatty (change of) liver, not elsewhere classified: Secondary | ICD-10-CM

## 2018-12-01 DIAGNOSIS — Z1211 Encounter for screening for malignant neoplasm of colon: Secondary | ICD-10-CM

## 2018-12-01 NOTE — Progress Notes (Signed)
THIS ENCOUNTER IS A VIRTUAL VISIT DUE TO COVID-19 - PATIENT WAS NOT SEEN IN THE OFFICE. PATIENT HAS CONSENTED TO VIRTUAL VISIT / TELEMEDICINE VISIT   Location of patient: home Location of provider: office Name of referring provider: Gilford Silvius FNP  Time spent on call:  20 minutes speaking with patient, > 30 including record of records, documentation, placing orders  HPI :  53 y/o male with a history of morbid obesity, back pain, OSA, referred for abdominal pain by Gilford Silvius FNP.   The pain is located in the right upper quadrant to right flank, along the ribs. Ongoing 4-5 months.  He had been seen in the weight / wellness program, lost 40 lbs, but the pain has persisted. He thinks during the daytime when he is working it is not bothering him too much. He works as a Curator and able to function, does not notice it much at all during that time. Pain is reproducible with palpation. He notices it more at night, when sleeping on right side. During the day it doesn't bother him. He has not had any association of the pain to his eating, does not seem to precipitate it. Pain does not radiate elsewhere. He does not think associated with shoulder or back pain. He is otherwise eating okay, no nausea or vomiting. He does have some heartburn / regurgitation at times, he takes TUMS PRN. Not routinely. No routine dysphagia. He denies any routine NSAID use. He uses gabapentin routinely for back / foot pain. He denies any positional component.   He denies any bowel trouble. No blood in the stools.   He weighs about 320 lbs or so.   No FH of gastric or colon cancer.  No tobacco. Rare alcohol. No significant alcohol.   No prior colonoscopy.   RUQ US done on 11/25/18 - cholelithiasis, diffuse fatty liver.   LFTs normal on last blood draw. Of note he did have a nonspecific leukocytosis and mild anemia.   Past Medical History:  Diagnosis Date  . Anxiety   . Back pain   . Dyspnea   . GERD  (gastroesophageal reflux disease)   . Hyperlipidemia   . Hypertension   . Joint pain   . Leg edema   . Leg pain   . Pain in both feet   . Sleep apnea   . Swallowing difficulty   . Thyroid disease      Past Surgical History:  Procedure Laterality Date  . FINGER SURGERY Right    torn ligament   Family History  Problem Relation Age of Onset  . Depression Mother   . Anxiety disorder Mother   . Diabetes Father   . Hypertension Father    Social History   Tobacco Use  . Smoking status: Former Smoker    Packs/day: 1.00    Years: 20.00    Pack years: 20.00    Last attempt to quit: 02/16/2007    Years since quitting: 11.7  . Smokeless tobacco: Never Used  Substance Use Topics  . Alcohol use: No  . Drug use: No   Current Outpatient Medications  Medication Sig Dispense Refill  . aspirin EC 81 MG tablet Take 81 mg by mouth daily.    Marland Kitchen atorvastatin (LIPITOR) 20 MG tablet Take 1 tablet (20 mg total) by mouth daily. 90 tablet 3  . chlorthalidone (HYGROTON) 25 MG tablet TAKE 1 TABLET BY MOUTH ONCE DAILY 90 tablet 0  . gabapentin (NEURONTIN) 300 MG capsule Take 1  capsule (300 mg total) by mouth 3 (three) times daily. 90 capsule 3  . levothyroxine (SYNTHROID, LEVOTHROID) 112 MCG tablet Take 1 tablet (112 mcg total) by mouth daily. 90 tablet 3  . lisinopril (PRINIVIL,ZESTRIL) 10 MG tablet Take 1 tablet (10 mg total) by mouth daily. 90 tablet 1  . Vitamin D, Ergocalciferol, (DRISDOL) 1.25 MG (50000 UT) CAPS capsule Take 1 capsule (50,000 Units total) by mouth every 7 (seven) days. 4 capsule 0   No current facility-administered medications for this visit.    No Known Allergies   Review of Systems: All systems reviewed and negative except where noted in HPI.    US Abdomen Limited Ruq  Result Date: 11/25/2018 CLINICAL DATA:  Upper abdominal pain EXAM: ULTRASOUND ABDOMEN LIMITED RIGHT UPPER QUADRANT COMPARISON:  None. FINDINGS: Gallbladder: Within the gallbladder, there are multiple  echogenic foci which move and shadow consistent with cholelithiasis. Largest gallstone measures 9 mm in length. The gallbladder wall does not appear appreciably thickened, and there is no pericholecystic fluid. No sonographic Murphy sign noted by sonographer. Common bile duct: Diameter: 5 mm. No intrahepatic or extrahepatic biliary duct dilatation. Liver: No focal lesion identified. Liver echogenicity is increased diffusely. Portal vein is patent on color Doppler imaging with normal direction of blood flow towards the liver. IMPRESSION: 1. Cholelithiasis. No appreciable gallbladder wall thickening or pericholecystic fluid. 2. Diffuse increase in liver echogenicity, a finding felt to be indicative of hepatic steatosis. While no focal liver lesions are evident on this study, it must be cautioned that the sensitivity of ultrasound for detection of focal liver lesions is diminished significantly in this circumstance. Electronically Signed   By: Bretta Bang III M.D.   On: 11/25/2018 08:51    Lab Results  Component Value Date   WBC 13.3 (H) 11/22/2018   HGB 12.9 (L) 11/22/2018   HCT 37.0 (L) 11/22/2018   MCV 86 11/22/2018   PLT 281 11/22/2018    Lab Results  Component Value Date   ALT 29 11/22/2018   AST 21 11/22/2018   ALKPHOS 62 11/22/2018   BILITOT 0.3 11/22/2018    Lab Results  Component Value Date   CREATININE 1.44 (H) 11/22/2018   BUN 22 11/22/2018   NA 136 11/22/2018   K 4.4 11/22/2018   CL 95 (L) 11/22/2018   CO2 28 11/22/2018     Physical Exam: NA / none  ASSESSMENT AND PLAN:  53 y/o male here for new patient assessment of the following:  Abdominal pain - RUQ / R flank - US shows gallstones but his symptoms are not c/w biliary colic. While I could not examine him today, it sounds reproducible with palpation and somewhat positional. I suspect he has musculoskeletal pain and we discussed this for a bit. He does have a leukocytosis on labs last week which is nonspecific,  his PCP recommended repeating it in 2 weeks which I think is reasonable. His LFTs are normal. I think increasing his gabapentin to TID is a good idea and he will do that. He can also use some tylenol PRN to see if that helps and try topical muscle rub. He has been doing a good job with weight loss efforts recently, he should continue to do that. Will await his course for a few weeks on this regimen, await repeat CBC. If symptoms persist or worsen, or leukocytosis worsens, may consider a CT scan. He agreed with the plan.   Gallstones - counseled him on this finding on [name], but based  on how he describes symptoms today, does not sound c/w biliary colic. He will keep on eye on things and see if any relation of his symptoms to eating, if he develops this he will contact me.   Fatty liver - his LFTs were most recently normal after losing some weight, which is good news. Counseled him on spectrum of NAFLD and risks for NASH / cirrhosis. He does not drink much alcohol and should continue to avoid it. He will continue to work on weight loss which should help this, he should see me yearly for this issue. No evidence of cirrhosis on [name].  Colon cancer screening - average risk for colon cancer, due for routine screening. I discussed options, recommend optical colonoscopy. Discussed risks / benefits and he wanted to proceed, further recommendations pending the results. Of note, due to COVID-19 outbreak this exam will be done once our office reopens for elective procedures.  Ileene Patrick, MD [facility]  CC: Reginia Forts Doralee Albino, FNP

## 2018-12-04 ENCOUNTER — Other Ambulatory Visit: Payer: Self-pay | Admitting: Pediatrics

## 2018-12-04 DIAGNOSIS — R600 Localized edema: Secondary | ICD-10-CM

## 2018-12-06 DIAGNOSIS — M19071 Primary osteoarthritis, right ankle and foot: Secondary | ICD-10-CM | POA: Diagnosis not present

## 2018-12-06 DIAGNOSIS — M722 Plantar fascial fibromatosis: Secondary | ICD-10-CM | POA: Diagnosis not present

## 2018-12-06 DIAGNOSIS — M79671 Pain in right foot: Secondary | ICD-10-CM | POA: Diagnosis not present

## 2018-12-08 ENCOUNTER — Ambulatory Visit: Payer: BLUE CROSS/BLUE SHIELD | Admitting: Family Medicine

## 2018-12-25 ENCOUNTER — Other Ambulatory Visit (INDEPENDENT_AMBULATORY_CARE_PROVIDER_SITE_OTHER): Payer: Self-pay | Admitting: Family Medicine

## 2018-12-25 ENCOUNTER — Other Ambulatory Visit: Payer: Self-pay | Admitting: Pediatrics

## 2018-12-25 DIAGNOSIS — E559 Vitamin D deficiency, unspecified: Secondary | ICD-10-CM

## 2018-12-25 DIAGNOSIS — R600 Localized edema: Secondary | ICD-10-CM

## 2018-12-28 DIAGNOSIS — G4733 Obstructive sleep apnea (adult) (pediatric): Secondary | ICD-10-CM | POA: Diagnosis not present

## 2018-12-30 ENCOUNTER — Other Ambulatory Visit: Payer: Self-pay | Admitting: Pediatrics

## 2018-12-30 DIAGNOSIS — R600 Localized edema: Secondary | ICD-10-CM

## 2018-12-31 ENCOUNTER — Other Ambulatory Visit: Payer: Self-pay | Admitting: Physician Assistant

## 2018-12-31 DIAGNOSIS — R600 Localized edema: Secondary | ICD-10-CM

## 2018-12-31 MED ORDER — CHLORTHALIDONE 25 MG PO TABS: 25.0000 mg | ORAL_TABLET | Freq: Every day | ORAL | 1 refills | Status: DC

## 2019-01-21 ENCOUNTER — Other Ambulatory Visit: Payer: Self-pay | Admitting: Pediatrics

## 2019-01-21 DIAGNOSIS — I10 Essential (primary) hypertension: Secondary | ICD-10-CM

## 2019-02-16 ENCOUNTER — Telehealth: Payer: Self-pay

## 2019-02-16 NOTE — Telephone Encounter (Signed)
Called and scheduled for colon in the [place] on 6-30 and previsit.

## 2019-02-27 ENCOUNTER — Ambulatory Visit (AMBULATORY_SURGERY_CENTER): Payer: Self-pay

## 2019-02-27 ENCOUNTER — Other Ambulatory Visit: Payer: Self-pay

## 2019-02-27 VITALS — Ht 72.0 in | Wt 325.0 lb

## 2019-02-27 DIAGNOSIS — Z1211 Encounter for screening for malignant neoplasm of colon: Secondary | ICD-10-CM

## 2019-02-27 NOTE — Progress Notes (Signed)
Denies allergies to eggs or soy products. Denies complication of anesthesia or sedation. Denies use of weight loss medication. Denies use of O2.   Emmi instructions given for colonoscopy.  Pre-Visit was conducted by phone due to Covid 19. Instructions were reviewed with patient and mailed to confirmed home address. Patient was encouraged to call if he had any questions or concerns regarding instructions. A 15.00 coupon for Suprep was given to the patient.

## 2019-02-28 DIAGNOSIS — G4733 Obstructive sleep apnea (adult) (pediatric): Secondary | ICD-10-CM | POA: Diagnosis not present

## 2019-03-07 ENCOUNTER — Encounter: Payer: BLUE CROSS/BLUE SHIELD | Admitting: Gastroenterology

## 2019-05-17 ENCOUNTER — Other Ambulatory Visit: Payer: Self-pay | Admitting: *Deleted

## 2019-05-17 DIAGNOSIS — E039 Hypothyroidism, unspecified: Secondary | ICD-10-CM

## 2019-05-17 MED ORDER — LEVOTHYROXINE SODIUM 112 MCG PO TABS: 112.0000 ug | ORAL_TABLET | Freq: Every day | ORAL | 3 refills | Status: DC

## 2019-06-26 ENCOUNTER — Other Ambulatory Visit: Payer: Self-pay | Admitting: Physician Assistant

## 2019-06-26 DIAGNOSIS — R600 Localized edema: Secondary | ICD-10-CM

## 2019-06-27 ENCOUNTER — Other Ambulatory Visit: Payer: Self-pay | Admitting: Family Medicine

## 2019-06-27 DIAGNOSIS — E039 Hypothyroidism, unspecified: Secondary | ICD-10-CM

## 2019-06-27 DIAGNOSIS — I10 Essential (primary) hypertension: Secondary | ICD-10-CM

## 2019-06-27 MED ORDER — LISINOPRIL 10 MG PO TABS: 10.0000 mg | ORAL_TABLET | Freq: Every day | ORAL | 0 refills | Status: DC

## 2019-06-27 MED ORDER — LEVOTHYROXINE SODIUM 112 MCG PO TABS: 112.0000 ug | ORAL_TABLET | Freq: Every day | ORAL | 0 refills | Status: DC

## 2019-06-27 NOTE — Telephone Encounter (Signed)
Patient aware.

## 2019-06-27 NOTE — Telephone Encounter (Signed)
meds sent to pharmacy

## 2019-07-13 ENCOUNTER — Ambulatory Visit (INDEPENDENT_AMBULATORY_CARE_PROVIDER_SITE_OTHER): Payer: BC Managed Care – PPO | Admitting: Family Medicine

## 2019-07-13 ENCOUNTER — Other Ambulatory Visit: Payer: Self-pay

## 2019-07-13 ENCOUNTER — Encounter (INDEPENDENT_AMBULATORY_CARE_PROVIDER_SITE_OTHER): Payer: Self-pay | Admitting: Family Medicine

## 2019-07-13 VITALS — BP 113/74 | HR 74 | Temp 97.9°F | Ht 72.0 in | Wt 326.0 lb

## 2019-07-13 DIAGNOSIS — E559 Vitamin D deficiency, unspecified: Secondary | ICD-10-CM

## 2019-07-13 DIAGNOSIS — Z9189 Other specified personal risk factors, not elsewhere classified: Secondary | ICD-10-CM | POA: Diagnosis not present

## 2019-07-13 DIAGNOSIS — E8881 Metabolic syndrome: Secondary | ICD-10-CM | POA: Diagnosis not present

## 2019-07-13 DIAGNOSIS — Z6841 Body Mass Index (BMI) 40.0 and over, adult: Secondary | ICD-10-CM

## 2019-07-13 MED ORDER — VITAMIN D (ERGOCALCIFEROL) 1.25 MG (50000 UNIT) PO CAPS: 50000.0000 [IU] | ORAL_CAPSULE | ORAL | 0 refills | Status: DC

## 2019-07-17 NOTE — Progress Notes (Signed)
Office: [phone]  /  Fax: [phone]   HPI:   Chief Complaint: OBESITY Joe CliffRicky is here to discuss his progress with his obesity treatment plan. He was advised to follow the Category 4 plan at his last visit and he is following his eating plan approximately 0 % of the time. He states he is walking on the treadmill 30 minutes 3 times per week. Joe CliffRicky had his last office visit in March 2020. He has not been on a plan since then but he has been trying to make good choices. He has only gained 3 pounds. He would like a goal for weight loss by his next visit. His weight is (!) 326 lb (147.9 kg) today and has had a weight gain of 3 pounds over a period of 8 months since his last visit. He has lost 14 lbs since starting treatment with Joe Romero.  Vitamin D deficiency Joe CliffRicky has a diagnosis of vitamin D deficiency. Joe CliffRicky has been off vitamin D prescription since March 2020. He is not at goal. Last vitamin D level was 15.2 on 09/20/2018. Joe Romero admits fatigue and denies nausea, vomiting or muscle weakness.  At risk for osteopenia and osteoporosis Joe CliffRicky is at higher risk of osteopenia and osteoporosis due to vitamin D deficiency.   Insulin Resistance Joe CliffRicky has a diagnosis of insulin resistance based on his elevated fasting insulin level >5. Although Joe Romero's blood glucose readings are still under good control, insulin resistance puts him at greater risk of metabolic syndrome and diabetes. He is not taking metformin currently and continues to work on diet and exercise to decrease risk of diabetes. Lab Results  Component Value Date   HGBA1C 5.5 09/20/2018    ASSESSMENT AND PLAN:  Vitamin D deficiency - Plan: Vitamin D, Ergocalciferol, (DRISDOL) 1.25 MG (50000 UT) CAPS capsule  Insulin resistance  At risk for osteoporosis  Class 3 severe obesity with serious comorbidity and body mass index (BMI) of 40.0 to 44.9 in adult, unspecified obesity type (HCC)  PLAN:  Vitamin D Deficiency . Joe Romero agrees to  continue to take prescription Vit D @50 ,000 IU every week #4 with no refills and he will follow up for routine testing of vitamin D, at least 2-3 times per year.  At risk for osteopenia and osteoporosis Joe CliffRicky was given extended  (15 minutes) osteoporosis prevention counseling today. Joe CliffRicky is at risk for osteopenia and osteoporosis due to his vitamin D deficiency. He was encouraged to take his vitamin D and follow his higher calcium diet and increase strengthening exercise to help strengthen his bones and decrease his risk of osteopenia and osteoporosis.  Insulin Resistance Joe CliffRicky will continue to work on weight loss, exercise, and decreasing simple carbohydrates in his diet to help decrease the risk of diabetes.  Joe CliffRicky will continue with the meal plan and follow up with Joe Romero as directed to monitor his progress.  Obesity Joe CliffRicky is currently in the action stage of change. As such, his goal is to continue with weight loss efforts He has agreed to follow the Category 4 plan Joe CliffRicky will continue walking on the treadmill for 30 minutes, 3 times per week for weight loss and overall health benefits. We discussed the following Behavioral Modification Strategies today: planning for success, increasing lean protein intake and decreasing simple carbohydrates   We set a goal together for him to lose 8 pounds over 4 weeks.  Joe CliffRicky has agreed to follow up with our clinic in 4 weeks. He was informed of the importance of frequent follow  up visits to maximize his success with intensive lifestyle modifications for his multiple health conditions.  ALLERGIES: No Known Allergies  MEDICATIONS: Current Outpatient Medications on File Prior to Visit  Medication Sig Dispense Refill   aspirin EC 81 MG tablet Take 81 mg by mouth daily.     atorvastatin (LIPITOR) 20 MG tablet Take 1 tablet (20 mg total) by mouth daily. 90 tablet 3   chlorthalidone (HYGROTON) 25 MG tablet Take 1 tablet (25 mg total) by mouth daily. (Needs  to be seen before next refill) 30 tablet 0   gabapentin (NEURONTIN) 300 MG capsule Take 1 capsule (300 mg total) by mouth 3 (three) times daily. 90 capsule 3   levothyroxine (SYNTHROID) 112 MCG tablet Take 1 tablet (112 mcg total) by mouth daily. 30 tablet 0   lisinopril (ZESTRIL) 10 MG tablet Take 1 tablet (10 mg total) by mouth daily. (Needs to be seen before next refill) 30 tablet 0   No current facility-administered medications on file prior to visit.     PAST MEDICAL HISTORY: Past Medical History:  Diagnosis Date   Anxiety    Back pain    Dyspnea    GERD (gastroesophageal reflux disease)    Hyperlipidemia    Hypertension    Joint pain    Leg edema    Leg pain    Pain in both feet    Sleep apnea    Swallowing difficulty    Thyroid disease     PAST SURGICAL HISTORY: Past Surgical History:  Procedure Laterality Date   FINGER SURGERY Right    torn ligament    SOCIAL HISTORY: Social History   Tobacco Use   Smoking status: Former Smoker    Packs/day: 1.00    Years: 20.00    Pack years: 20.00    Quit date: 02/16/2007    Years since quitting: 12.4   Smokeless tobacco: Never Used  Substance Use Topics   Alcohol use: No   Drug use: No    FAMILY HISTORY: Family History  Problem Relation Age of Onset   Depression Mother    Anxiety disorder Mother    Diabetes Father    Hypertension Father    Colon cancer Neg Hx    Esophageal cancer Neg Hx    Rectal cancer Neg Hx    Stomach cancer Neg Hx     ROS: Review of Systems  Constitutional: Positive for malaise/fatigue. Negative for weight loss.  Gastrointestinal: Negative for nausea and vomiting.  Musculoskeletal:       Negative for muscle weakness    PHYSICAL EXAM: Blood pressure 113/74, pulse 74, temperature 97.9 F (36.6 C), temperature source Oral, height 6' (1.829 m), weight (!) 326 lb (147.9 kg), SpO2 97 %. Body mass index is 44.21 kg/m. Physical Exam Vitals signs reviewed.    Constitutional:      Appearance: Normal appearance. He is well-developed. He is obese.  Cardiovascular:     Rate and Rhythm: Normal rate.  Pulmonary:     Effort: Pulmonary effort is normal.  Musculoskeletal: Normal range of motion.  Skin:    General: Skin is warm and dry.  Neurological:     Mental Status: He is alert and oriented to person, place, and time.  Psychiatric:        Mood and Affect: Mood normal.        Behavior: Behavior normal.     RECENT LABS AND TESTS: BMET    Component Value Date/Time   NA 136 11/22/2018 1447  K 4.4 11/22/2018 1447   CL 95 (L) 11/22/2018 1447   CO2 28 11/22/2018 1447   GLUCOSE 86 11/22/2018 1447   BUN 22 11/22/2018 1447   CREATININE 1.44 (H) 11/22/2018 1447   CALCIUM 9.2 11/22/2018 1447   GFRNONAA 56 (L) 11/22/2018 1447   GFRAA 65 11/22/2018 1447   Lab Results  Component Value Date   HGBA1C 5.5 09/20/2018   HGBA1C 5.1 02/15/2017   Lab Results  Component Value Date   INSULIN 23.7 09/20/2018   CBC    Component Value Date/Time   WBC 13.3 (H) 11/22/2018 1447   RBC 4.28 11/22/2018 1447   HGB 12.9 (L) 11/22/2018 1447   HCT 37.0 (L) 11/22/2018 1447   PLT 281 11/22/2018 1447   MCV 86 11/22/2018 1447   MCH 30.1 11/22/2018 1447   MCHC 34.9 11/22/2018 1447   RDW 13.3 11/22/2018 1447   LYMPHSABS 2.5 11/22/2018 1447   EOSABS 0.2 11/22/2018 1447   BASOSABS 0.1 11/22/2018 1447   Iron/TIBC/Ferritin/ %Sat No results found for: IRON, TIBC, FERRITIN, IRONPCTSAT Lipid Panel     Component Value Date/Time   CHOL 210 (H) 11/22/2018 1447   TRIG 254 (H) 11/22/2018 1447   HDL 34 (L) 11/22/2018 1447   CHOLHDL 6.2 (H) 11/22/2018 1447   LDLCALC 125 (H) 11/22/2018 1447   Hepatic Function Panel     Component Value Date/Time   PROT 6.7 11/22/2018 1447   ALBUMIN 4.2 11/22/2018 1447   AST 21 11/22/2018 1447   ALT 29 11/22/2018 1447   ALKPHOS 62 11/22/2018 1447   BILITOT 0.3 11/22/2018 1447      Component Value Date/Time   TSH 5.400 (H)  09/20/2018 1310   TSH 3.770 03/17/2018 0955   TSH 2.220 04/19/2017 1041     Ref. Range 09/20/2018 13:10  Vitamin D, 25-Hydroxy Latest Ref Range: 30.0 - 100.0 ng/mL 15.2 (L)    OBESITY BEHAVIORAL INTERVENTION VISIT  Today's visit was # 5   Starting weight: 340 lbs Starting date: 09/20/2018 Today's weight : 326 lbs Today's date: 07/13/2019 Total lbs lost to date: 14    07/13/2019  Height 6' (1.829 m)  Weight 326 lb (147.9 kg) (A)  BMI (Calculated) 44.2  BLOOD PRESSURE - SYSTOLIC 113  BLOOD PRESSURE - DIASTOLIC 74   Body Fat % 39.2 %  Total Body Water (lbs) 142 lbs    ASK: We discussed the diagnosis of obesity with Joe Romero today and Joe Romero agreed to give [name] permission to discuss obesity behavioral modification therapy today.  ASSESS: Joe Romero has the diagnosis of obesity and his BMI today is 44.2 Joe Romero is in the action stage of change   ADVISE: Joe Romero was educated on the multiple health risks of obesity as well as the benefit of weight loss to improve his health. He was advised of the need for long term treatment and the importance of lifestyle modifications to improve his current health and to decrease his risk of future health problems.  AGREE: Multiple dietary modification options and treatment options were discussed and  Joe Romero agreed to follow the recommendations documented in the above note.  ARRANGE: Joe Romero was educated on the importance of frequent visits to treat obesity as outlined per CMS and USPSTF guidelines and agreed to schedule his next follow up appointment today.  I, Joe Romero, am acting as transcriptionist for [place], FNP-C  I have reviewed the above documentation for accuracy and completeness, and I agree with the above.  - Arieona Swaggerty, FNP-C.

## 2019-07-18 ENCOUNTER — Encounter (INDEPENDENT_AMBULATORY_CARE_PROVIDER_SITE_OTHER): Payer: Self-pay | Admitting: Family Medicine

## 2019-07-18 ENCOUNTER — Other Ambulatory Visit: Payer: Self-pay

## 2019-07-18 DIAGNOSIS — E88819 Insulin resistance, unspecified: Secondary | ICD-10-CM | POA: Insufficient documentation

## 2019-07-18 DIAGNOSIS — E559 Vitamin D deficiency, unspecified: Secondary | ICD-10-CM | POA: Insufficient documentation

## 2019-07-18 DIAGNOSIS — E8881 Metabolic syndrome: Secondary | ICD-10-CM | POA: Insufficient documentation

## 2019-07-18 DIAGNOSIS — Z6841 Body Mass Index (BMI) 40.0 and over, adult: Secondary | ICD-10-CM | POA: Insufficient documentation

## 2019-07-19 ENCOUNTER — Encounter: Payer: Self-pay | Admitting: Family Medicine

## 2019-07-19 ENCOUNTER — Ambulatory Visit: Payer: BC Managed Care – PPO | Admitting: Family Medicine

## 2019-07-19 VITALS — BP 113/73 | HR 87 | Temp 98.0°F | Resp 20 | Ht 72.0 in | Wt 335.0 lb

## 2019-07-19 DIAGNOSIS — Z6841 Body Mass Index (BMI) 40.0 and over, adult: Secondary | ICD-10-CM

## 2019-07-19 DIAGNOSIS — E782 Mixed hyperlipidemia: Secondary | ICD-10-CM

## 2019-07-19 DIAGNOSIS — E559 Vitamin D deficiency, unspecified: Secondary | ICD-10-CM | POA: Diagnosis not present

## 2019-07-19 DIAGNOSIS — E039 Hypothyroidism, unspecified: Secondary | ICD-10-CM

## 2019-07-19 DIAGNOSIS — Z1211 Encounter for screening for malignant neoplasm of colon: Secondary | ICD-10-CM

## 2019-07-19 DIAGNOSIS — G629 Polyneuropathy, unspecified: Secondary | ICD-10-CM

## 2019-07-19 DIAGNOSIS — B351 Tinea unguium: Secondary | ICD-10-CM

## 2019-07-19 DIAGNOSIS — Z1212 Encounter for screening for malignant neoplasm of rectum: Secondary | ICD-10-CM

## 2019-07-19 DIAGNOSIS — I10 Essential (primary) hypertension: Secondary | ICD-10-CM | POA: Insufficient documentation

## 2019-07-19 MED ORDER — ATORVASTATIN CALCIUM 20 MG PO TABS: 20.0000 mg | ORAL_TABLET | Freq: Every day | ORAL | 3 refills | Status: DC

## 2019-07-19 MED ORDER — DICLOFENAC SODIUM 75 MG PO TBEC: 75.0000 mg | DELAYED_RELEASE_TABLET | Freq: Two times a day (BID) | ORAL | 3 refills | Status: AC

## 2019-07-19 MED ORDER — GABAPENTIN 300 MG PO CAPS: 300.0000 mg | ORAL_CAPSULE | Freq: Two times a day (BID) | ORAL | 1 refills | Status: DC

## 2019-07-19 MED ORDER — CHLORTHALIDONE 25 MG PO TABS: 25.0000 mg | ORAL_TABLET | Freq: Every day | ORAL | 0 refills | Status: DC

## 2019-07-19 MED ORDER — ASPIRIN EC 81 MG PO TBEC: 81.0000 mg | DELAYED_RELEASE_TABLET | Freq: Every day | ORAL | 3 refills | Status: DC

## 2019-07-19 NOTE — Patient Instructions (Signed)
DASH Eating Plan DASH stands for "Dietary Approaches to Stop Hypertension." The DASH eating plan is a healthy eating plan that has been shown to reduce high blood pressure (hypertension). Additional health benefits may include reducing the risk of type 2 diabetes mellitus, heart disease, and stroke. The DASH eating plan may also help with weight loss.  WHAT DO I NEED TO KNOW ABOUT THE DASH EATING PLAN? For the DASH eating plan, you will follow these general guidelines:  Choose foods with a percent daily value for sodium of less than 5% (as listed on the food label).  Use salt-free seasonings or herbs instead of table salt or sea salt.  Check with your health care provider or pharmacist before using salt substitutes.  Eat lower-sodium products, often labeled as "lower sodium" or "no salt added."  Eat fresh foods.  Eat more vegetables, fruits, and low-fat dairy products.  Choose whole grains. Look for the word "whole" as the first word in the ingredient list.  Choose fish and skinless chicken or turkey more often than red meat. Limit fish, poultry, and meat to 6 oz (170 g) each day.  Limit sweets, desserts, sugars, and sugary drinks.  Choose heart-healthy fats.  Limit cheese to 1 oz (28 g) per day.  Eat more home-cooked food and less restaurant, buffet, and fast food.  Limit fried foods.  Cook foods using methods other than frying.  Limit canned vegetables. If you do use them, rinse them well to decrease the sodium.  When eating at a restaurant, ask that your food be prepared with less salt, or no salt if possible.  WHAT FOODS CAN I EAT? Seek help from a dietitian for individual calorie needs.  Grains Whole grain or whole wheat bread. Brown rice. Whole grain or whole wheat pasta. Quinoa, bulgur, and whole grain cereals. Low-sodium cereals. Corn or whole wheat flour tortillas. Whole grain cornbread. Whole grain crackers. Low-sodium crackers.  Vegetables Fresh or frozen  vegetables (raw, steamed, roasted, or grilled). Low-sodium or reduced-sodium tomato and vegetable juices. Low-sodium or reduced-sodium tomato sauce and paste. Low-sodium or reduced-sodium canned vegetables.   Fruits All fresh, canned (in natural juice), or frozen fruits.  Meat and Other Protein Products Ground beef (85% or leaner), grass-fed beef, or beef trimmed of fat. Skinless chicken or turkey. Ground chicken or turkey. Pork trimmed of fat. All fish and seafood. Eggs. Dried beans, peas, or lentils. Unsalted nuts and seeds. Unsalted canned beans.  Dairy Low-fat dairy products, such as skim or 1% milk, 2% or reduced-fat cheeses, low-fat ricotta or cottage cheese, or plain low-fat yogurt. Low-sodium or reduced-sodium cheeses.  Fats and Oils Tub margarines without trans fats. Light or reduced-fat mayonnaise and salad dressings (reduced sodium). Avocado. Safflower, olive, or canola oils. Natural peanut or almond butter.  Other Unsalted popcorn and pretzels. The items listed above may not be a complete list of recommended foods or beverages. Contact your dietitian for more options.  WHAT FOODS ARE NOT RECOMMENDED?  Grains White bread. White pasta. White rice. Refined cornbread. Bagels and croissants. Crackers that contain trans fat.  Vegetables Creamed or fried vegetables. Vegetables in a cheese sauce. Regular canned vegetables. Regular canned tomato sauce and paste. Regular tomato and vegetable juices.  Fruits Dried fruits. Canned fruit in light or heavy syrup. Fruit juice.  Meat and Other Protein Products Fatty cuts of meat. Ribs, chicken wings, bacon, sausage, bologna, salami, chitterlings, fatback, hot dogs, bratwurst, and packaged luncheon meats. Salted nuts and seeds. Canned beans with salt.    Dairy Whole or 2% milk, cream, half-and-half, and cream cheese. Whole-fat or sweetened yogurt. Full-fat cheeses or blue cheese. Nondairy creamers and whipped toppings. Processed cheese,  cheese spreads, or cheese curds.  Condiments Onion and garlic salt, seasoned salt, table salt, and sea salt. Canned and packaged gravies. Worcestershire sauce. Tartar sauce. Barbecue sauce. Teriyaki sauce. Soy sauce, including reduced sodium. Steak sauce. Fish sauce. Oyster sauce. Cocktail sauce. Horseradish. Ketchup and mustard. Meat flavorings and tenderizers. Bouillon cubes. Hot sauce. Tabasco sauce. Marinades. Taco seasonings. Relishes.  Fats and Oils Butter, stick margarine, lard, shortening, ghee, and bacon fat. Coconut, palm kernel, or palm oils. Regular salad dressings.  Other Pickles and olives. Salted popcorn and pretzels.  The items listed above may not be a complete list of foods and beverages to avoid. Contact your dietitian for more information.  WHERE CAN I FIND MORE INFORMATION? National Heart, Lung, and [employer] Institute: www.nhlbi.nih.gov/health/health-topics/topics/dash/ Document Released: 08/13/2011 Document Revised: 01/08/2014 Document Reviewed: 06/28/2013 Exit[employer] Patient Information 2015 [employer]. This information is not intended to replace advice given to you by your health care provider. Make sure you discuss any questions you have with your health care provider.   I think that you would greatly benefit from seeing a nutritionist.  If you are interested, please call Dr Sykes at [phone] to schedule an appointment.   

## 2019-07-19 NOTE — Progress Notes (Signed)
Subjective:  Patient ID: Joe Romero, male    DOB: 03/23/1966, 53 y.o.   MRN: [id number]  Patient Care Team: Baruch Gouty, FNP as PCP - General (Family Medicine)   Chief Complaint:  Medical Management of Chronic Issues (6 mo ), Hyperlipidemia, and Hypothyroidism   HPI: Joe Romero is a 53 y.o. male presenting on 07/19/2019 for Medical Management of Chronic Issues (6 mo ), Hyperlipidemia, and Hypothyroidism  Pt following up today for management of chronic medical conditions. Pt states he has not been see by a PCP in several months. He has been going to weight management and was recently restarted on Vit D repletion therapy. Pt states he has been trying to watch is diet and exercise on a regular basis. Pt states he has been out of his medications for several months and needs to restart them. States he has not taken his levothyroxine in several months. He denies fatigue, constipation, weight changes, hair or skin changes, nail changes, palpitations, or edema. Pt states he does have bilateral lower extremity neuropathy. States he takes Neurontin twice daily to manage this pain and this works very well. Pt states he does not check his blood pressure at home on a regular basis. States he did have an episode of dizziness a few weeks back and his daughter checked his blood pressure and told him it was low so he stopped his lisinopril. States he is only taking the [place] at present for his blood pressure. No chest pain, focal deficits, weakness, syncope, or headaches. No fatigue or myalgias. Pt denies changes in urinary output.   Pt states he does have a problem with his left foot. States he has an abnormally thick and yellow colored great toenail on that foot. States this has been like this for several months. States he has tried topical creams for the symptoms with no relief.    Relevant past medical, surgical, family, and social history reviewed and updated as indicated.  Allergies and medications  reviewed and updated. Date reviewed: Chart in Epic.   Past Medical History:  Diagnosis Date  . Anxiety   . Back pain   . Dyspnea   . GERD (gastroesophageal reflux disease)   . Hyperlipidemia   . Hypertension   . Joint pain   . Leg edema   . Leg pain   . Pain in both feet   . Sleep apnea   . Swallowing difficulty   . Thyroid disease     Past Surgical History:  Procedure Laterality Date  . FINGER SURGERY Right    torn ligament    Social History   Socioeconomic History  . Marital status: Married    Spouse name: Khary Schaben  . Number of children: Not on file  . Years of education: Not on file  . Highest education level: Not on file  Occupational History  . Occupation: Designer, television/film set  Social Needs  . Financial resource strain: Not on file  . Food insecurity    Worry: Not on file    Inability: Not on file  . Transportation needs    Medical: Not on file    Non-medical: Not on file  Tobacco Use  . Smoking status: Former Smoker    Packs/day: 1.00    Years: 20.00    Pack years: 20.00    Quit date: 02/16/2007    Years since quitting: 12.4  . Smokeless tobacco: Never Used  Substance and Sexual Activity  . Alcohol use:  No  . Drug use: No  . Sexual activity: Not on file  Lifestyle  . Physical activity    Days per week: Not on file    Minutes per session: Not on file  . Stress: Not on file  Relationships  . Social Herbalist on phone: Not on file    Gets together: Not on file    Attends religious service: Not on file    Active member of club or organization: Not on file    Attends meetings of clubs or organizations: Not on file    Relationship status: Not on file  . Intimate partner violence    Fear of current or ex partner: Not on file    Emotionally abused: Not on file    Physically abused: Not on file    Forced sexual activity: Not on file  Other Topics Concern  . Not on file  Social History Narrative  . Not on file     Outpatient Encounter Medications as of 07/19/2019  Medication Sig  . aspirin EC 81 MG tablet Take 1 tablet (81 mg total) by mouth daily.  Marland Kitchen atorvastatin (LIPITOR) 20 MG tablet Take 1 tablet (20 mg total) by mouth daily.  . chlorthalidone (HYGROTON) 25 MG tablet Take 1 tablet (25 mg total) by mouth daily. (Needs to be seen before next refill)  . diclofenac (VOLTAREN) 75 MG EC tablet Take 1 tablet (75 mg total) by mouth 2 (two) times daily.  Marland Kitchen gabapentin (NEURONTIN) 300 MG capsule Take 1 capsule (300 mg total) by mouth 2 (two) times daily.  . Vitamin D, Ergocalciferol, (DRISDOL) 1.25 MG (50000 UT) CAPS capsule Take 1 capsule (50,000 Units total) by mouth every 7 (seven) days.  . [DISCONTINUED] aspirin EC 81 MG tablet Take 81 mg by mouth daily.  . [DISCONTINUED] atorvastatin (LIPITOR) 20 MG tablet Take 1 tablet (20 mg total) by mouth daily.  . [DISCONTINUED] chlorthalidone (HYGROTON) 25 MG tablet Take 1 tablet (25 mg total) by mouth daily. (Needs to be seen before next refill)  . [DISCONTINUED] diclofenac (VOLTAREN) 75 MG EC tablet Take 75 mg by mouth 2 (two) times daily.  . [DISCONTINUED] gabapentin (NEURONTIN) 300 MG capsule Take 1 capsule (300 mg total) by mouth 3 (three) times daily. (Patient taking differently: Take 300 mg by mouth 2 (two) times daily. )  . levothyroxine (SYNTHROID) 112 MCG tablet Take 1 tablet (112 mcg total) by mouth daily. (Patient not taking: Reported on 07/19/2019)  . [DISCONTINUED] lisinopril (ZESTRIL) 10 MG tablet Take 1 tablet (10 mg total) by mouth daily. (Needs to be seen before next refill)   No facility-administered encounter medications on file as of 07/19/2019.     No Known Allergies  Review of Systems  Constitutional: Negative for activity change, appetite change, chills, diaphoresis, fatigue, fever and unexpected weight change.  HENT: Negative.   Eyes: Negative.  Negative for photophobia and visual disturbance.  Respiratory: Negative for cough, chest  tightness and shortness of breath.   Cardiovascular: Negative for chest pain, palpitations and leg swelling.  Gastrointestinal: Negative for abdominal distention, abdominal pain, anal bleeding, blood in stool, constipation, diarrhea, nausea, rectal pain and vomiting.  Endocrine: Negative.  Negative for cold intolerance, heat intolerance, polydipsia, polyphagia and polyuria.  Genitourinary: Negative for decreased urine volume, difficulty urinating, dysuria, frequency and urgency.  Musculoskeletal: Negative for arthralgias and myalgias.  Skin:       Thick, discolored toenail  Allergic/Immunologic: Negative.   Neurological: Negative for dizziness, tremors,  seizures, syncope, facial asymmetry, speech difficulty, weakness, light-headedness, numbness and headaches.  Hematological: Negative.   Psychiatric/Behavioral: Negative for confusion, hallucinations, sleep disturbance and suicidal ideas.  All other systems reviewed and are negative.       Objective:  BP 113/73   Pulse 87   Temp 98 F (36.7 C)   Resp 20   Ht 6' (1.829 m)   Wt (!) 335 lb (152 kg)   SpO2 97%   BMI 45.43 kg/m    Wt Readings from Last 3 Encounters:  07/19/19 (!) 335 lb (152 kg)  07/13/19 (!) 326 lb (147.9 kg)  02/27/19 (!) 325 lb (147.4 kg)    Physical Exam Vitals signs and nursing note reviewed.  Constitutional:      General: He is not in acute distress.    Appearance: Normal appearance. He is well-developed and well-groomed. He is morbidly obese. He is not ill-appearing, toxic-appearing or diaphoretic.  HENT:     Head: Normocephalic and atraumatic.     Jaw: There is normal jaw occlusion.     Right Ear: Hearing, tympanic membrane, ear canal and external ear normal.     Left Ear: Hearing, tympanic membrane, ear canal and external ear normal.     Nose: Nose normal.     Mouth/Throat:     Lips: Pink.     Mouth: Mucous membranes are moist.     Pharynx: Oropharynx is clear. Uvula midline.  Eyes:     General:  Lids are normal.     Extraocular Movements: Extraocular movements intact.     Conjunctiva/sclera: Conjunctivae normal.     Pupils: Pupils are equal, round, and reactive to light.  Neck:     Musculoskeletal: Normal range of motion and neck supple.     Thyroid: No thyroid mass, thyromegaly or thyroid tenderness.     Vascular: No carotid bruit or JVD.     Trachea: Trachea and phonation normal.  Cardiovascular:     Rate and Rhythm: Normal rate and regular rhythm.     Chest Wall: PMI is not displaced.     Pulses: Normal pulses.     Heart sounds: Normal heart sounds. No murmur. No friction rub. No gallop.   Pulmonary:     Effort: Pulmonary effort is normal. No respiratory distress.     Breath sounds: Normal breath sounds. No wheezing.  Abdominal:     General: Bowel sounds are normal. There is no distension or abdominal bruit.     Palpations: Abdomen is soft. There is no hepatomegaly or splenomegaly.     Tenderness: There is no abdominal tenderness. There is no right CVA tenderness or left CVA tenderness.     Hernia: No hernia is present.  Musculoskeletal: Normal range of motion.     Right lower leg: No edema.     Left lower leg: No edema.  Feet:     Left foot:     Toenail Condition: Left toenails are abnormally thick. Fungal disease present. Lymphadenopathy:     Cervical: No cervical adenopathy.  Skin:    General: Skin is warm and dry.     Capillary Refill: Capillary refill takes less than 2 seconds.     Coloration: Skin is not cyanotic, jaundiced or pale.     Findings: No rash.  Neurological:     General: No focal deficit present.     Mental Status: He is alert and oriented to person, place, and time.     Cranial Nerves: Cranial nerves are intact.  No cranial nerve deficit.     Sensory: Sensation is intact. No sensory deficit.     Motor: Motor function is intact. No weakness.     Coordination: Coordination is intact. Coordination normal.     Gait: Gait is intact. Gait normal.      Deep Tendon Reflexes: Reflexes are normal and symmetric. Reflexes normal.  Psychiatric:        Attention and Perception: Attention and perception normal.        Mood and Affect: Mood and affect normal.        Speech: Speech normal.        Behavior: Behavior normal. Behavior is cooperative.        Thought Content: Thought content normal.        Cognition and Memory: Cognition and memory normal.        Judgment: Judgment normal.     Results for orders placed or performed in visit on 11/22/18  CMP14+EGFR  Result Value Ref Range   Glucose 86 65 - 99 mg/dL   BUN 22 6 - 24 mg/dL   Creatinine, Ser 1.44 (H) 0.76 - 1.27 mg/dL   GFR calc non Af Amer 56 (L) >59 mL/min/1.73   GFR calc Af Amer 65 >59 mL/min/1.73   BUN/Creatinine Ratio 15 9 - 20   Sodium 136 134 - 144 mmol/L   Potassium 4.4 3.5 - 5.2 mmol/L   Chloride 95 (L) 96 - 106 mmol/L   CO2 28 20 - 29 mmol/L   Calcium 9.2 8.7 - 10.2 mg/dL   Total Protein 6.7 6.0 - 8.5 g/dL   Albumin 4.2 3.8 - 4.9 g/dL   Globulin, Total 2.5 1.5 - 4.5 g/dL   Albumin/Globulin Ratio 1.7 1.2 - 2.2   Bilirubin Total 0.3 0.0 - 1.2 mg/dL   Alkaline Phosphatase 62 39 - 117 IU/L   AST 21 0 - 40 IU/L   ALT 29 0 - 44 IU/L  Lipid panel  Result Value Ref Range   Cholesterol, Total 210 (H) 100 - 199 mg/dL   Triglycerides 254 (H) 0 - 149 mg/dL   HDL 34 (L) >39 mg/dL   VLDL Cholesterol Cal 51 (H) 5 - 40 mg/dL   LDL Calculated 125 (H) 0 - 99 mg/dL   Chol/HDL Ratio 6.2 (H) 0.0 - 5.0 ratio  Amylase  Result Value Ref Range   Amylase 23 (L) 31 - 110 U/L  Lipase  Result Value Ref Range   Lipase 15 13 - 78 U/L  CBC with Differential/Platelet  Result Value Ref Range   WBC 13.3 (H) 3.4 - 10.8 x10E3/uL   RBC 4.28 4.14 - 5.80 x10E6/uL   Hemoglobin 12.9 (L) 13.0 - 17.7 g/dL   Hematocrit 37.0 (L) 37.5 - 51.0 %   MCV 86 79 - 97 fL   MCH 30.1 26.6 - 33.0 pg   MCHC 34.9 31.5 - 35.7 g/dL   RDW 13.3 11.6 - 15.4 %   Platelets 281 150 - 450 x10E3/uL   Neutrophils 70  Not Estab. %   Lymphs 19 Not Estab. %   Monocytes 8 Not Estab. %   Eos 1 Not Estab. %   Basos 1 Not Estab. %   Neutrophils Absolute 9.4 (H) 1.4 - 7.0 x10E3/uL   Lymphocytes Absolute 2.5 0.7 - 3.1 x10E3/uL   Monocytes Absolute 1.0 (H) 0.1 - 0.9 x10E3/uL   EOS (ABSOLUTE) 0.2 0.0 - 0.4 x10E3/uL   Basophils Absolute 0.1 0.0 - 0.2 x10E3/uL   Immature  Granulocytes 1 Not Estab. %   Immature Grans (Abs) 0.1 0.0 - 0.1 x10E3/uL       Pertinent labs & imaging results that were available during my care of the patient were reviewed by me and considered in my medical decision making.  Assessment & Plan:  Zandyr was seen today for medical management of chronic issues, hyperlipidemia and hypothyroidism.  Diagnoses and all orders for this visit:  Acquired hypothyroidism Pt has not been taking repletion therapy for several months. Will recheck today and reinitiate if warranted.  -     Thyroid Panel With TSH  Mixed hyperlipidemia Diet encouraged - increase intake of fresh fruits and vegetables, increase intake of lean proteins. Bake, broil, or grill foods. Avoid fried, greasy, and fatty foods. Avoid fast foods. Increase intake of fiber-rich whole grains. Exercise encouraged - at least 150 minutes per week and advance as tolerated.  Goal BMI < 25. Continue medications as prescribed. Follow up in 3-6 months as discussed.  -     Lipid panel -     atorvastatin (LIPITOR) 20 MG tablet; Take 1 tablet (20 mg total) by mouth daily.  Morbid obesity with BMI of 40.0-44.9, adult ([place]) Diet and exercise encouraged. Is seeing weight management. Will check below today. Keep follow ups with weight management and try to increase treadmill time to at least 30 minutes 5 times per week.  -     CMP14+EGFR -     CBC with Differential/Platelet -     Lipid panel -     Thyroid Panel With TSH  Vitamin D deficiency Labs pending. Continue repletion therapy. If indicated, will change repletion dosage. Eat foods rich in Vit D  including milk, orange juice, yogurt with vitamin D added, salmon or mackerel, canned tuna fish, cereals with vitamin D added, and cod liver oil. Get out in the sun but make sure to wear at least SPF 30 sunscreen.  -     Vitamin D 25 hydroxy  Neuropathy Well controlled with below. Will continue. Will check CMP today for renal function.  -     diclofenac (VOLTAREN) 75 MG EC tablet; Take 1 tablet (75 mg total) by mouth 2 (two) times daily. -     gabapentin (NEURONTIN) 300 MG capsule; Take 1 capsule (300 mg total) by mouth 2 (two) times daily.  Screening for colorectal cancer Not high risk for colorectal cancer. Does not want to have colonoscopy. Agreeable to cologuard. Order placed.  -     Cologuard  Essential hypertension BP well controlled. Changes were not made in regimen today. Will not reinitiate the Lisinopril. Goal BP is 130/80. Pt aware to report any persistent high or low readings. DASH diet and exercise encouraged. Exercise at least 150 minutes per week and increase as tolerated. Goal BMI > 25. Stress management encouraged. Avoid nicotine and tobacco product use. Avoid excessive alcohol and NSAID's. Avoid more than 2000 mg of sodium daily. Medications as prescribed. Follow up as scheduled.  -     aspirin EC 81 MG tablet; Take 1 tablet (81 mg total) by mouth daily. -     chlorthalidone (HYGROTON) 25 MG tablet; Take 1 tablet (25 mg total) by mouth daily. (Needs to be seen before next refill)  Onychomycosis of left great toe Will check CMP today and initiate Lamisil oral for 12 weeks if liver enzymes are normal.     Continue all other maintenance medications.  Follow up plan: Return in about 6 months (around 01/16/2020), or  if symptoms worsen or fail to improve, for HTN.  Continue healthy lifestyle choices, including diet (rich in fruits, vegetables, and lean proteins, and low in salt and simple carbohydrates) and exercise (at least 30 minutes of moderate physical activity daily).   Educational handout given for DASH diet  The above assessment and management plan was discussed with the patient. The patient verbalized understanding of and has agreed to the management plan. Patient is aware to call the clinic if they develop any new symptoms or if symptoms persist or worsen. Patient is aware when to return to the clinic for a follow-up visit. Patient educated on when it is appropriate to go to the emergency department.   Monia Pouch, FNP-C [facility] [phone]

## 2019-07-20 DIAGNOSIS — B351 Tinea unguium: Secondary | ICD-10-CM | POA: Insufficient documentation

## 2019-07-20 LAB — CMP14+EGFR
ALT: 43 IU/L (ref 0–44)
AST: 32 IU/L (ref 0–40)
Albumin/Globulin Ratio: 1.9 (ref 1.2–2.2)
Albumin: 4.5 g/dL (ref 3.8–4.9)
Alkaline Phosphatase: 63 IU/L (ref 39–117)
BUN/Creatinine Ratio: 19 (ref 9–20)
BUN: 24 mg/dL (ref 6–24)
Bilirubin Total: 0.4 mg/dL (ref 0.0–1.2)
CO2: 27 mmol/L (ref 20–29)
Calcium: 9.5 mg/dL (ref 8.7–10.2)
Chloride: 98 mmol/L (ref 96–106)
Creatinine, Ser: 1.26 mg/dL (ref 0.76–1.27)
GFR calc Af Amer: 75 mL/min/{1.73_m2} (ref >59)
GFR calc non Af Amer: 65 mL/min/{1.73_m2} (ref >59)
Globulin, Total: 2.4 g/dL (ref 1.5–4.5)
Glucose: 95 mg/dL (ref 65–99)
Potassium: 4.3 mmol/L (ref 3.5–5.2)
Sodium: 140 mmol/L (ref 134–144)
Total Protein: 6.9 g/dL (ref 6.0–8.5)

## 2019-07-20 LAB — CBC WITH DIFFERENTIAL/PLATELET
Basophils Absolute: 0.1 10*3/uL (ref 0.0–0.2)
Basos: 1 %
EOS (ABSOLUTE): 0.2 10*3/uL (ref 0.0–0.4)
Eos: 2 %
Hematocrit: 43.1 % (ref 37.5–51.0)
Hemoglobin: 14.5 g/dL (ref 13.0–17.7)
Immature Grans (Abs): 0 10*3/uL (ref 0.0–0.1)
Immature Granulocytes: 0 %
Lymphocytes Absolute: 2.2 10*3/uL (ref 0.7–3.1)
Lymphs: 22 %
MCH: 30.6 pg (ref 26.6–33.0)
MCHC: 33.6 g/dL (ref 31.5–35.7)
MCV: 91 fL (ref 79–97)
Monocytes Absolute: 0.7 10*3/uL (ref 0.1–0.9)
Monocytes: 7 %
Neutrophils Absolute: 6.8 10*3/uL (ref 1.4–7.0)
Neutrophils: 68 %
Platelets: 253 10*3/uL (ref 150–450)
RBC: 4.74 x10E6/uL (ref 4.14–5.80)
RDW: 13.9 % (ref 11.6–15.4)
WBC: 9.9 10*3/uL (ref 3.4–10.8)

## 2019-07-20 LAB — VITAMIN D 25 HYDROXY (VIT D DEFICIENCY, FRACTURES): Vit D, 25-Hydroxy: 28.5 ng/mL — ABNORMAL LOW (ref 30.0–100.0)

## 2019-07-20 LAB — LIPID PANEL
Chol/HDL Ratio: 5.1 ratio — ABNORMAL HIGH (ref 0.0–5.0)
Cholesterol, Total: 183 mg/dL (ref 100–199)
HDL: 36 mg/dL — ABNORMAL LOW (ref >39)
LDL Chol Calc (NIH): 121 mg/dL — ABNORMAL HIGH (ref 0–99)
Triglycerides: 143 mg/dL (ref 0–149)
VLDL Cholesterol Cal: 26 mg/dL (ref 5–40)

## 2019-07-20 LAB — THYROID PANEL WITH TSH
Free Thyroxine Index: 0.6 — ABNORMAL LOW (ref 1.2–4.9)
T3 Uptake Ratio: 16 % — ABNORMAL LOW (ref 24–39)
T4, Total: 3.8 ug/dL — ABNORMAL LOW (ref 4.5–12.0)
TSH: 37.1 u[IU]/mL — ABNORMAL HIGH (ref 0.450–4.500)

## 2019-07-20 MED ORDER — LEVOTHYROXINE SODIUM 112 MCG PO TABS: 112.0000 ug | ORAL_TABLET | Freq: Every day | ORAL | 1 refills | Status: DC

## 2019-07-20 MED ORDER — TERBINAFINE HCL 250 MG PO TABS: 250.0000 mg | ORAL_TABLET | Freq: Every day | ORAL | 0 refills | Status: AC

## 2019-07-24 ENCOUNTER — Other Ambulatory Visit: Payer: Self-pay | Admitting: Family Medicine

## 2019-07-24 DIAGNOSIS — I10 Essential (primary) hypertension: Secondary | ICD-10-CM

## 2019-07-26 ENCOUNTER — Other Ambulatory Visit: Payer: Self-pay | Admitting: Family Medicine

## 2019-07-26 DIAGNOSIS — E559 Vitamin D deficiency, unspecified: Secondary | ICD-10-CM

## 2019-07-26 DIAGNOSIS — I10 Essential (primary) hypertension: Secondary | ICD-10-CM

## 2019-07-26 MED ORDER — VITAMIN D (ERGOCALCIFEROL) 1.25 MG (50000 UNIT) PO CAPS: 50000.0000 [IU] | ORAL_CAPSULE | ORAL | 0 refills | Status: DC

## 2019-07-26 MED ORDER — CHLORTHALIDONE 25 MG PO TABS: 25.0000 mg | ORAL_TABLET | Freq: Every day | ORAL | 0 refills | Status: DC

## 2019-07-26 NOTE — Telephone Encounter (Signed)
Pt aware refills sent to pharmacy 

## 2019-08-10 ENCOUNTER — Ambulatory Visit (INDEPENDENT_AMBULATORY_CARE_PROVIDER_SITE_OTHER): Payer: BC Managed Care – PPO | Admitting: Family Medicine

## 2019-08-21 IMAGING — US ULTRASOUND ABDOMEN LIMITED
1 series · 14 of 25 positions shown · non-contrast
Comparison: None.

CLINICAL DATA: Upper abdominal pain

EXAM:
ULTRASOUND ABDOMEN LIMITED RIGHT UPPER QUADRANT

[Series 1: ultrasound abdomen limited · 14 of 64 slices shown]
[im 1/64]
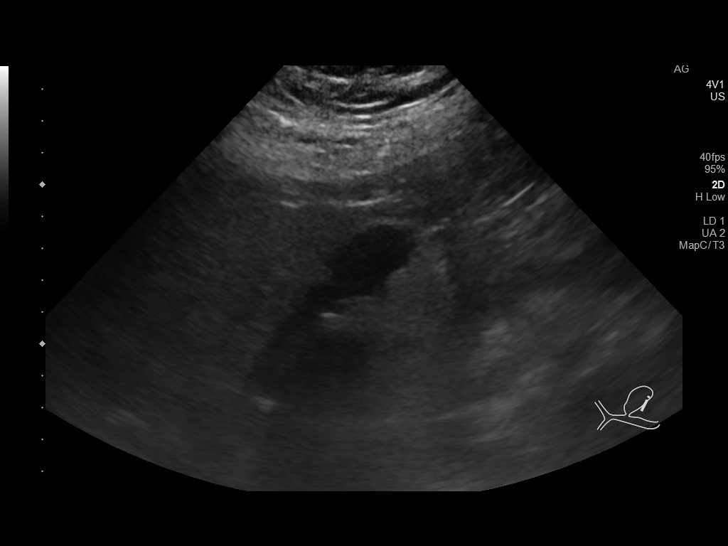
[im 6/64]
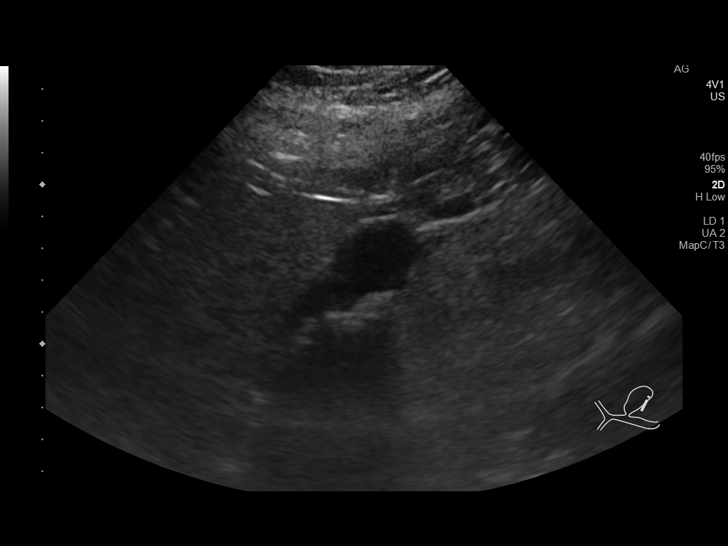
[im 11/64]
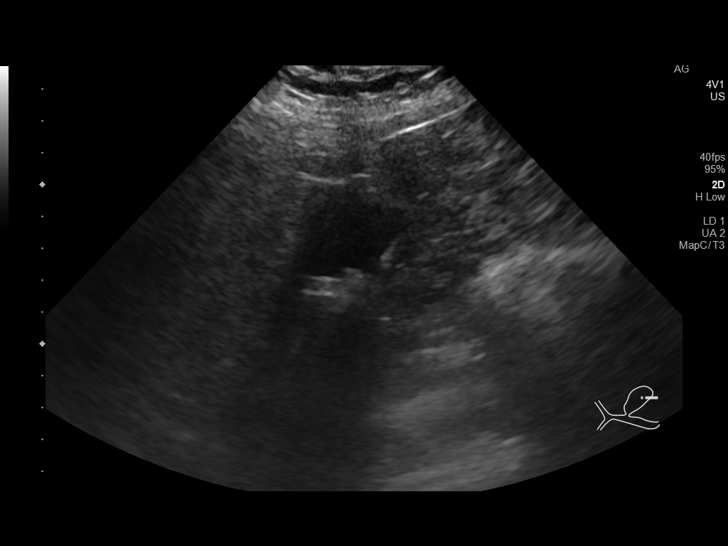
[im 16/64]
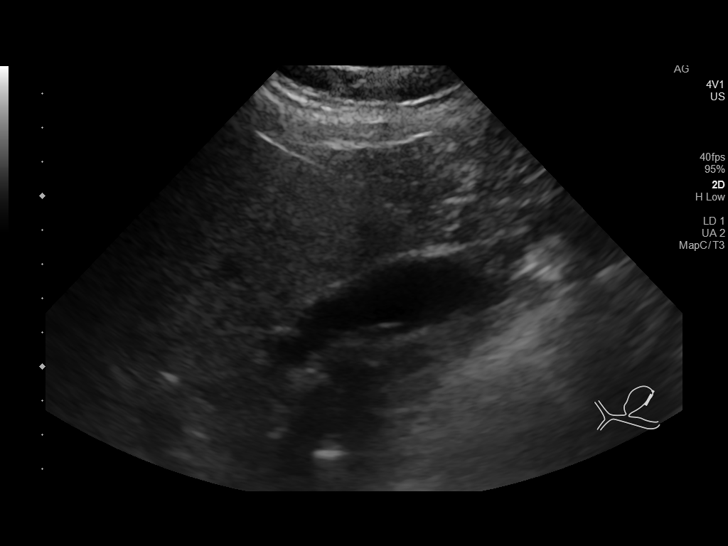
[im 22/64]
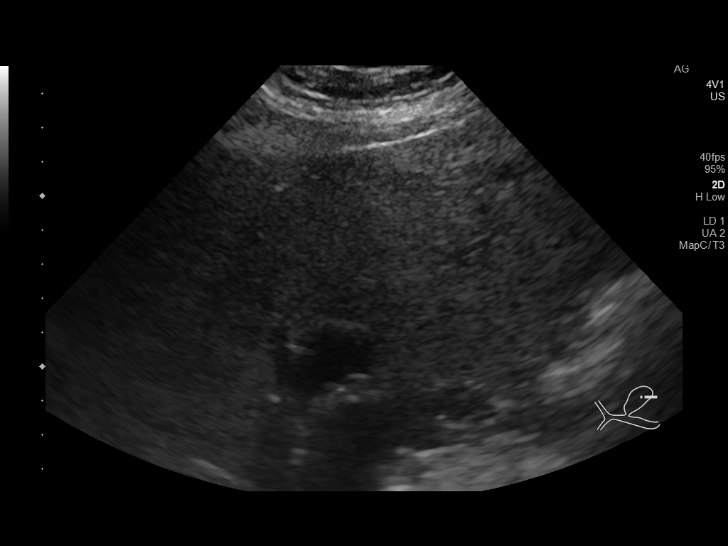
[im 24/64]
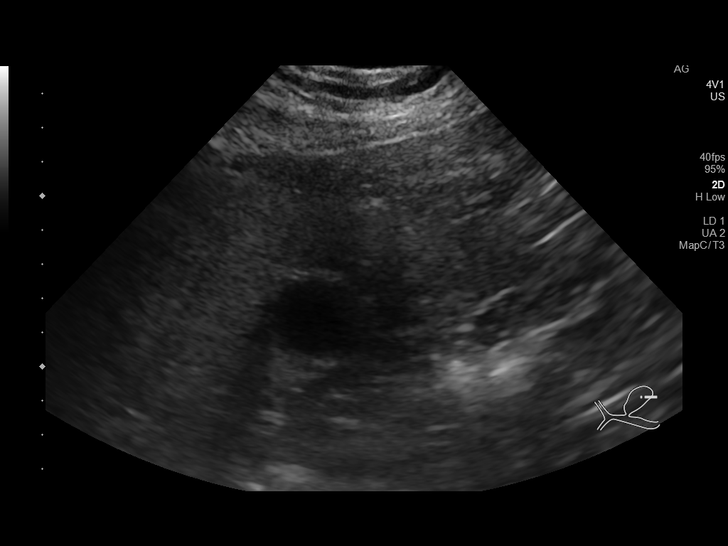
[im 29/64]
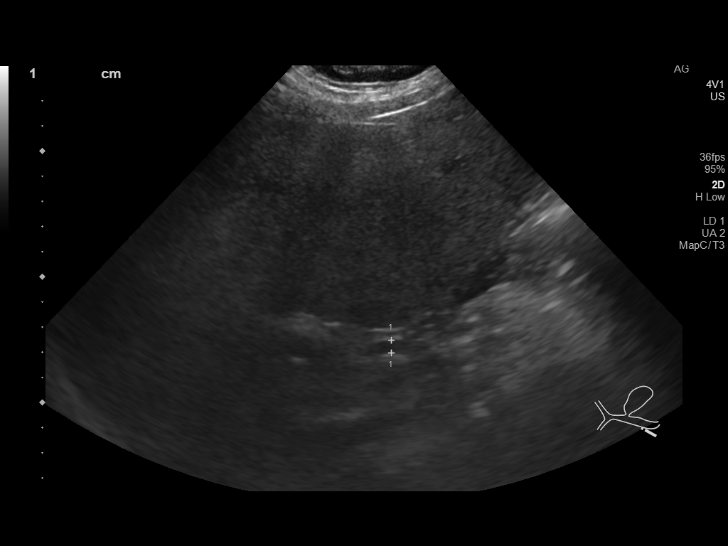
[im 35/64]
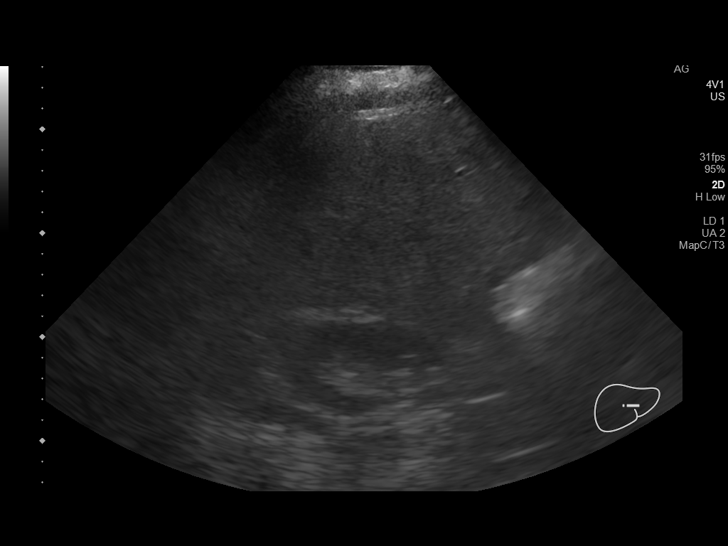
[im 40/64]
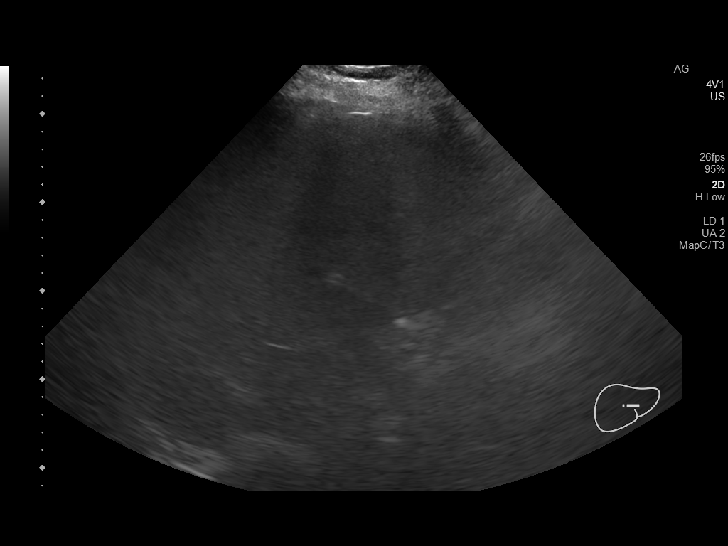
[im 43/64]
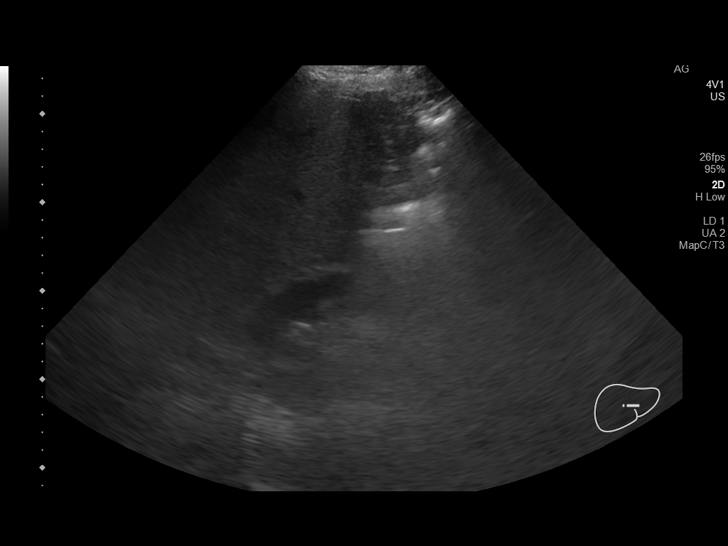
[im 48/64]
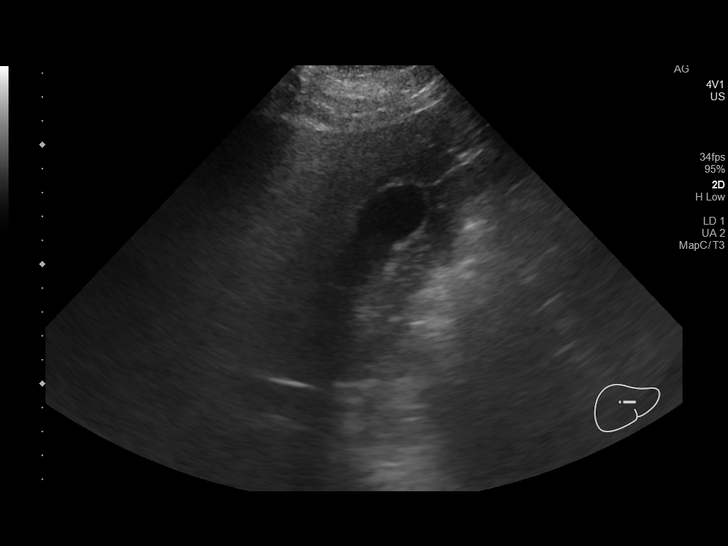
[im 53/64]
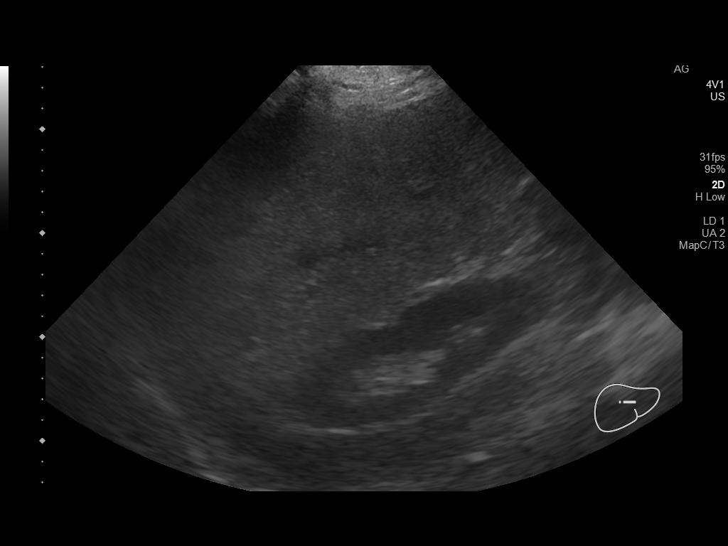
[im 58/64]
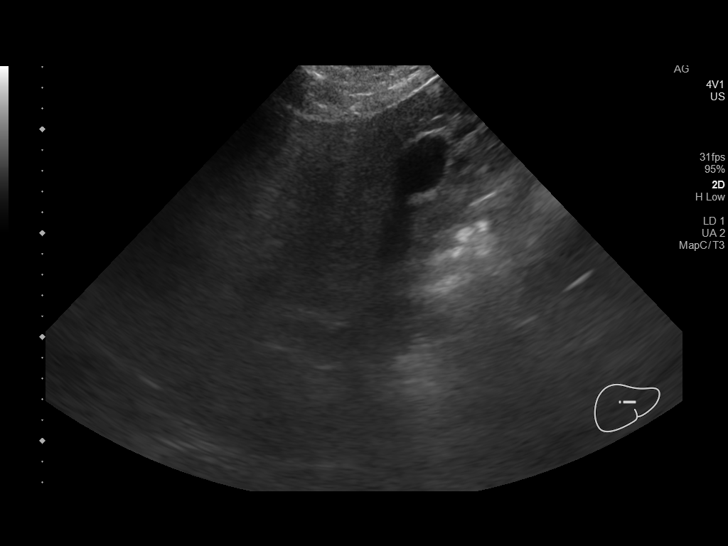
[im 64/64]
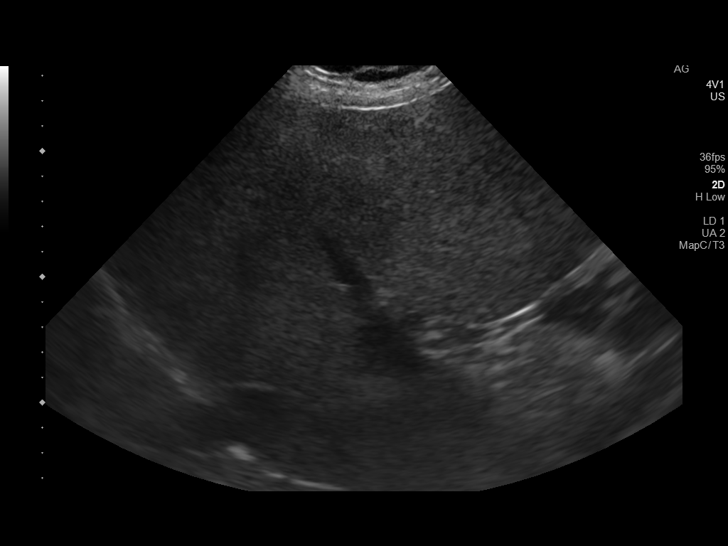

[14 of 25 positions shown; findings below may reference images not displayed]

FINDINGS: Gallbladder:

Within the gallbladder, there are multiple echogenic foci which move
and shadow consistent with cholelithiasis. Largest gallstone
measures 9 mm in length. The gallbladder wall does not appear
appreciably thickened, and there is no pericholecystic fluid. No
sonographic Murphy sign noted by sonographer.

Common bile duct:

Diameter: 5 mm. No intrahepatic or extrahepatic biliary duct
dilatation.

Liver:

No focal lesion identified. Liver echogenicity is increased
diffusely. Portal vein is patent on color Doppler imaging with
normal direction of blood flow towards the liver.
IMPRESSION: 1. Cholelithiasis. No appreciable gallbladder wall thickening or
pericholecystic fluid.

2. Diffuse increase in liver echogenicity, a finding felt to be
indicative of hepatic steatosis. While no focal liver lesions are
evident on this study, it must be cautioned that the sensitivity of
ultrasound for detection of focal liver lesions is diminished
significantly in this circumstance.

## 2019-10-24 DIAGNOSIS — G4733 Obstructive sleep apnea (adult) (pediatric): Secondary | ICD-10-CM | POA: Diagnosis not present

## 2019-11-18 ENCOUNTER — Other Ambulatory Visit: Payer: Self-pay | Admitting: Family Medicine

## 2019-11-18 DIAGNOSIS — I10 Essential (primary) hypertension: Secondary | ICD-10-CM

## 2019-12-07 ENCOUNTER — Encounter: Payer: Self-pay | Admitting: *Deleted

## 2020-01-17 ENCOUNTER — Ambulatory Visit: Payer: BC Managed Care – PPO | Admitting: Family Medicine

## 2020-01-18 ENCOUNTER — Encounter: Payer: Self-pay | Admitting: Family Medicine

## 2020-01-22 ENCOUNTER — Other Ambulatory Visit: Payer: Self-pay | Admitting: *Deleted

## 2020-01-22 DIAGNOSIS — I10 Essential (primary) hypertension: Secondary | ICD-10-CM

## 2020-01-22 MED ORDER — CHLORTHALIDONE 25 MG PO TABS: 25.0000 mg | ORAL_TABLET | Freq: Every day | ORAL | 0 refills | Status: DC

## 2020-01-29 ENCOUNTER — Ambulatory Visit: Payer: BC Managed Care – PPO | Admitting: Family Medicine

## 2020-01-30 ENCOUNTER — Other Ambulatory Visit: Payer: Self-pay | Admitting: *Deleted

## 2020-01-30 DIAGNOSIS — I10 Essential (primary) hypertension: Secondary | ICD-10-CM

## 2020-02-22 ENCOUNTER — Encounter: Payer: Self-pay | Admitting: Family Medicine

## 2020-02-22 ENCOUNTER — Other Ambulatory Visit: Payer: Self-pay

## 2020-02-22 ENCOUNTER — Ambulatory Visit: Payer: BC Managed Care – PPO | Admitting: Family Medicine

## 2020-02-22 VITALS — BP 126/79 | HR 82 | Temp 97.4°F | Ht 72.0 in | Wt 328.4 lb

## 2020-02-22 DIAGNOSIS — I10 Essential (primary) hypertension: Secondary | ICD-10-CM | POA: Diagnosis not present

## 2020-02-22 DIAGNOSIS — Z6841 Body Mass Index (BMI) 40.0 and over, adult: Secondary | ICD-10-CM

## 2020-02-22 DIAGNOSIS — Z1211 Encounter for screening for malignant neoplasm of colon: Secondary | ICD-10-CM

## 2020-02-22 DIAGNOSIS — E782 Mixed hyperlipidemia: Secondary | ICD-10-CM

## 2020-02-22 DIAGNOSIS — E559 Vitamin D deficiency, unspecified: Secondary | ICD-10-CM | POA: Diagnosis not present

## 2020-02-22 DIAGNOSIS — E039 Hypothyroidism, unspecified: Secondary | ICD-10-CM | POA: Diagnosis not present

## 2020-02-22 DIAGNOSIS — G629 Polyneuropathy, unspecified: Secondary | ICD-10-CM

## 2020-02-22 DIAGNOSIS — R7303 Prediabetes: Secondary | ICD-10-CM

## 2020-02-22 DIAGNOSIS — Z125 Encounter for screening for malignant neoplasm of prostate: Secondary | ICD-10-CM | POA: Diagnosis not present

## 2020-02-22 DIAGNOSIS — Z23 Encounter for immunization: Secondary | ICD-10-CM | POA: Diagnosis not present

## 2020-02-22 DIAGNOSIS — R072 Precordial pain: Secondary | ICD-10-CM

## 2020-02-22 DIAGNOSIS — Z1159 Encounter for screening for other viral diseases: Secondary | ICD-10-CM

## 2020-02-22 MED ORDER — METOPROLOL SUCCINATE ER 25 MG PO TB24
25.0000 mg | ORAL_TABLET | Freq: Every day | ORAL | 3 refills | Status: DC
Start: 2020-02-22 — End: 2020-04-24

## 2020-02-22 MED ORDER — GABAPENTIN 600 MG PO TABS: 600.0000 mg | ORAL_TABLET | Freq: Two times a day (BID) | ORAL | 1 refills | Status: DC

## 2020-02-22 NOTE — Progress Notes (Signed)
Subjective:  Patient ID: Joe Romero,  male    DOB: 1965/09/08  Age: 54 y.o.    CC: Follow-up (3 month)   HPI Joe Romero presents for  follow-up of hypertension. Patient has no history of headache chest pain or shortness of breath or recent cough. Patient also denies symptoms of TIA such as numbness weakness lateralizing. Patient denies side effects from medication. States taking it regularly.  Patient also  in for follow-up of elevated cholesterol. Doing well without complaints on current medication. Denies side effects  including myalgia and arthralgia and nausea. Also in today for liver function testing. Currently no chest pain, shortness of breath or other cardiovascular related symptoms noted.  Follow-up of diabetes. Patient does check blood sugar at home.  Patient denies symptoms such as excessive hunger or urinary frequency, excessive hunger, nausea No significant hypoglycemic spells noted. Medications reviewed. Pt reports taking them regularly. Pt. denies complication/adverse reaction today.   Pt. Reports intermittent chest pain that is not always exertional. It is not radiating. It is precordial. He has multiple cardiac risk factors.    History Joe Romero has a past medical history of Anxiety, Back pain, Dyspnea, GERD (gastroesophageal reflux disease), Hyperlipidemia, Hypertension, Joint pain, Leg edema, Leg pain, Pain in both feet, Sleep apnea, Swallowing difficulty, and Thyroid disease.   He has a past surgical history that includes Finger surgery (Right).   His family history includes Anxiety disorder in his mother; Depression in his mother; Diabetes in his father; Hypertension in his father.He reports that he quit smoking about 13 years ago. He has a 20.00 pack-year smoking history. He has never used smokeless tobacco. He reports that he does not drink alcohol and does not use drugs.  Current Outpatient Medications on File Prior to Visit  Medication Sig Dispense Refill  .  aspirin EC 81 MG tablet Take 1 tablet (81 mg total) by mouth daily. 90 tablet 3  . atorvastatin (LIPITOR) 20 MG tablet Take 1 tablet (20 mg total) by mouth daily. 90 tablet 3  . diclofenac (VOLTAREN) 75 MG EC tablet Take 75 mg by mouth 2 (two) times daily.    Marland Kitchen gabapentin (NEURONTIN) 300 MG capsule Take 1 capsule (300 mg total) by mouth 2 (two) times daily. 180 capsule 1  . levothyroxine (SYNTHROID) 112 MCG tablet Take 1 tablet (112 mcg total) by mouth daily. 90 tablet 1  . Vitamin D, Ergocalciferol, (DRISDOL) 1.25 MG (50000 UT) CAPS capsule Take 1 capsule (50,000 Units total) by mouth every 7 (seven) days. (Patient not taking: Reported on 02/22/2020) 12 capsule 0   No current facility-administered medications on file prior to visit.    ROS Review of Systems  Constitutional: Negative.   HENT: Negative.   Eyes: Negative for visual disturbance.  Respiratory: Negative for cough and shortness of breath.   Cardiovascular: Negative for chest pain and leg swelling.  Gastrointestinal: Negative for abdominal pain, diarrhea, nausea and vomiting.  Genitourinary: Negative for difficulty urinating.  Musculoskeletal: Positive for arthralgias (foot pain relief with diclofenac is good. ). Negative for myalgias.  Skin: Negative for rash.  Neurological: Positive for numbness (in toes). Negative for headaches.  Psychiatric/Behavioral: Negative for sleep disturbance.    Objective:  BP 126/79   Pulse 82   Temp (!) 97.4 F (36.3 C) (Temporal)   Ht 6' (1.829 m)   Wt (!) 328 lb 6.4 oz (149 kg)   BMI 44.54 kg/m   BP Readings from Last 3 Encounters:  02/22/20 126/79  07/19/19 113/73  07/13/19 113/74    Wt Readings from Last 3 Encounters:  02/22/20 (!) 328 lb 6.4 oz (149 kg)  07/19/19 (!) 335 lb (152 kg)  07/13/19 (!) 326 lb (147.9 kg)     Physical Exam Vitals reviewed.  Constitutional:      Appearance: He is well-developed.  HENT:     Head: Normocephalic and atraumatic.     Right Ear:  Tympanic membrane and external ear normal. No decreased hearing noted.     Left Ear: Tympanic membrane and external ear normal. No decreased hearing noted.     Mouth/Throat:     Pharynx: No oropharyngeal exudate or posterior oropharyngeal erythema.  Eyes:     Pupils: Pupils are equal, round, and reactive to light.  Cardiovascular:     Rate and Rhythm: Normal rate and regular rhythm.     Heart sounds: No murmur heard.   Pulmonary:     Effort: No respiratory distress.     Breath sounds: Normal breath sounds.  Abdominal:     General: Bowel sounds are normal.     Palpations: Abdomen is soft. There is no mass.     Tenderness: There is no abdominal tenderness.  Musculoskeletal:     Cervical back: Normal range of motion and neck supple.     Diabetic Foot Exam - Simple   Simple Foot Form Diabetic Foot exam was performed with the following findings: Yes 02/22/2020 10:16 AM  Visual Inspection No deformities, no ulcerations, no other skin breakdown bilaterally: Yes Sensation Testing Intact to touch and monofilament testing bilaterally: Yes Pulse Check Posterior Tibialis and Dorsalis pulse intact bilaterally: Yes Comments       Assessment & Plan:   Joe Romero was seen today for follow-up.  Diagnoses and all orders for this visit:  Precordial pain -     Ambulatory referral to Cardiology -     CBC with Differential/Platelet -     CMP14+EGFR  Acquired hypothyroidism -     CBC with Differential/Platelet -     CMP14+EGFR -     TSH + free T4  Mixed hyperlipidemia -     Ambulatory referral to Cardiology -     CBC with Differential/Platelet -     CMP14+EGFR -     Lipid panel  Morbid obesity with BMI of 40.0-44.9, adult ([place]) -     Ambulatory referral to Cardiology -     CBC with Differential/Platelet -     CMP14+EGFR  Vitamin D deficiency -     CBC with Differential/Platelet -     CMP14+EGFR -     VITAMIN D 25 Hydroxy (Vit-D Deficiency, Fractures)  Essential hypertension -      Ambulatory referral to Cardiology -     CBC with Differential/Platelet -     CMP14+EGFR  Prediabetes -     Ambulatory referral to Cardiology -     CBC with Differential/Platelet -     CMP14+EGFR  Need for hepatitis C screening test -     Hepatitis C antibody  Screening for prostate cancer -     PSA Total (Reflex To Free)  Screen for colon cancer -     Cologuard  Neuropathy  Other orders -     metoprolol succinate (TOPROL-XL) 25 MG 24 hr tablet; Take 1 tablet (25 mg total) by mouth daily. For heart and blood pressure -     gabapentin (NEURONTIN) 600 MG tablet; Take 1 tablet (600 mg total) by mouth 2 (two) times daily. -  Tdap vaccine greater than or equal to 7yo IM   I have discontinued Marice Dunsmore's chlorthalidone. I am also having him start on metoprolol succinate and gabapentin. Additionally, I am having him maintain his aspirin EC, atorvastatin, gabapentin, levothyroxine, Vitamin D (Ergocalciferol), and diclofenac.  Meds ordered this encounter  Medications  . metoprolol succinate (TOPROL-XL) 25 MG 24 hr tablet    Sig: Take 1 tablet (25 mg total) by mouth daily. For heart and blood pressure    Dispense:  90 tablet    Refill:  3  . gabapentin (NEURONTIN) 600 MG tablet    Sig: Take 1 tablet (600 mg total) by mouth 2 (two) times daily.    Dispense:  180 tablet    Refill:  1     Follow-up: Return in about 3 months (around 05/24/2020).  Claretta Fraise, M.D.

## 2020-02-23 ENCOUNTER — Other Ambulatory Visit: Payer: Self-pay | Admitting: Family Medicine

## 2020-02-23 DIAGNOSIS — E039 Hypothyroidism, unspecified: Secondary | ICD-10-CM

## 2020-02-23 LAB — VITAMIN D 25 HYDROXY (VIT D DEFICIENCY, FRACTURES): Vit D, 25-Hydroxy: 31.5 ng/mL (ref 30.0–100.0)

## 2020-02-23 LAB — CBC WITH DIFFERENTIAL/PLATELET
Basophils Absolute: 0.1 10*3/uL (ref 0.0–0.2)
Basos: 1 %
EOS (ABSOLUTE): 0.2 10*3/uL (ref 0.0–0.4)
Eos: 2 %
Hematocrit: 42.6 % (ref 37.5–51.0)
Hemoglobin: 14.2 g/dL (ref 13.0–17.7)
Immature Grans (Abs): 0.1 10*3/uL (ref 0.0–0.1)
Immature Granulocytes: 1 %
Lymphocytes Absolute: 2.5 10*3/uL (ref 0.7–3.1)
Lymphs: 23 %
MCH: 30.2 pg (ref 26.6–33.0)
MCHC: 33.3 g/dL (ref 31.5–35.7)
MCV: 91 fL (ref 79–97)
Monocytes Absolute: 0.6 10*3/uL (ref 0.1–0.9)
Monocytes: 6 %
Neutrophils Absolute: 7.3 10*3/uL — ABNORMAL HIGH (ref 1.4–7.0)
Neutrophils: 67 %
Platelets: 275 10*3/uL (ref 150–450)
RBC: 4.7 x10E6/uL (ref 4.14–5.80)
RDW: 13.6 % (ref 11.6–15.4)
WBC: 10.7 10*3/uL (ref 3.4–10.8)

## 2020-02-23 LAB — CMP14+EGFR
ALT: 52 IU/L — ABNORMAL HIGH (ref 0–44)
AST: 28 IU/L (ref 0–40)
Albumin/Globulin Ratio: 1.8 (ref 1.2–2.2)
Albumin: 4.2 g/dL (ref 3.8–4.9)
Alkaline Phosphatase: 74 IU/L (ref 48–121)
BUN/Creatinine Ratio: 15 (ref 9–20)
BUN: 18 mg/dL (ref 6–24)
Bilirubin Total: 0.3 mg/dL (ref 0.0–1.2)
CO2: 25 mmol/L (ref 20–29)
Calcium: 9 mg/dL (ref 8.7–10.2)
Chloride: 99 mmol/L (ref 96–106)
Creatinine, Ser: 1.17 mg/dL (ref 0.76–1.27)
GFR calc Af Amer: 82 mL/min/{1.73_m2} (ref >59)
GFR calc non Af Amer: 71 mL/min/{1.73_m2} (ref >59)
Globulin, Total: 2.3 g/dL (ref 1.5–4.5)
Glucose: 95 mg/dL (ref 65–99)
Potassium: 3.6 mmol/L (ref 3.5–5.2)
Sodium: 140 mmol/L (ref 134–144)
Total Protein: 6.5 g/dL (ref 6.0–8.5)

## 2020-02-23 LAB — LIPID PANEL
Chol/HDL Ratio: 5.4 ratio — ABNORMAL HIGH (ref 0.0–5.0)
Cholesterol, Total: 163 mg/dL (ref 100–199)
HDL: 30 mg/dL — ABNORMAL LOW (ref >39)
LDL Chol Calc (NIH): 96 mg/dL (ref 0–99)
Triglycerides: 214 mg/dL — ABNORMAL HIGH (ref 0–149)
VLDL Cholesterol Cal: 37 mg/dL (ref 5–40)

## 2020-02-23 LAB — PSA TOTAL (REFLEX TO FREE): Prostate Specific Ag, Serum: 0.3 ng/mL (ref 0.0–4.0)

## 2020-02-23 LAB — TSH+FREE T4
Free T4: 1.04 ng/dL (ref 0.82–1.77)
TSH: 9.05 u[IU]/mL — ABNORMAL HIGH (ref 0.450–4.500)

## 2020-02-23 LAB — HEPATITIS C ANTIBODY: Hep C Virus Ab: <0.1 s/co ratio (ref 0.0–0.9)

## 2020-02-23 MED ORDER — LEVOTHYROXINE SODIUM 125 MCG PO TABS: 125.0000 ug | ORAL_TABLET | Freq: Every day | ORAL | 1 refills | Status: DC

## 2020-02-27 ENCOUNTER — Telehealth: Payer: Self-pay | Admitting: Family Medicine

## 2020-02-27 NOTE — Telephone Encounter (Signed)
Patient aware of levothyroxine dosage and instructions

## 2020-02-27 NOTE — Addendum Note (Signed)
Addended by: Diamantina Monks on: 02/27/2020 09:27 AM   Modules accepted: Orders

## 2020-02-29 ENCOUNTER — Telehealth: Payer: Self-pay | Admitting: Family Medicine

## 2020-02-29 NOTE — Telephone Encounter (Signed)
Yes I did make the change in order to protect his kidneys.  The reason is his kidney function 6 months ago was at 56% and is now at 65%.  Although those are respectable numbers, it is best to try to protect the kidneys from them getting worse.  The pharmacist raised a good point about this.  Additionally, metoprolol protects the heart a bit better and I chose that medicine in light of your mention of chest pain.  It is just a good precaution to protect your heart where we can.

## 2020-02-29 NOTE — Telephone Encounter (Signed)
Pt aware of provider feedback and voiced understanding. 

## 2020-02-29 NOTE — Telephone Encounter (Signed)
Pt had questions regarding why his medications were changed from Chlorithalidone to Metoprolol. Pt states he had spoke with Dr Darlyn Read and pharmacy and they had brought up that regular usage of Diclofenac is hard on the kidneys and that Chlorithalidone is as well. He wanted to know if this was the reason the medication was changed. His last kidney function was WNL so just wanted to make sure this was the case.

## 2020-03-04 ENCOUNTER — Telehealth: Payer: Self-pay | Admitting: Family Medicine

## 2020-03-04 NOTE — Telephone Encounter (Signed)
He will do as provider suggested although he is not comfortable stopping the medicine.   He will call back  with readings and be alert if blood pressure starts going up too much.

## 2020-03-04 NOTE — Telephone Encounter (Signed)
Pt states that he stopped taking the metoprolol succinate (TOPROL-XL) 25 MG 24 hr tablet Because it was making him feel dizzy and sluggish. He states his blood pressure was reading 100/60. Pt states he does not like how the medication makes him feel and would like to see if it can be changed.

## 2020-03-04 NOTE — Telephone Encounter (Signed)
Have him stay off of it for now. Keep an eye on BP. If it goes above 135/85 at rest, let me know.

## 2020-03-04 NOTE — Telephone Encounter (Signed)
Suggestions on changing medications?

## 2020-03-12 NOTE — Telephone Encounter (Signed)
Spoke with patient and advised him that you are out of the office until tomorrow.  Can you check on this please.

## 2020-03-12 NOTE — Telephone Encounter (Signed)
Please check on the referral. Okay to continue the fluid pill

## 2020-03-12 NOTE — Telephone Encounter (Signed)
Pt states that after stopping the medication he swelled a lot in his feet and in Sunday he started taking his old BP medication, chlorthalidone and that helped with the fluid. Pt is wanting to know what he needs to do. Requesting to speak with the nurse.

## 2020-03-12 NOTE — Telephone Encounter (Addendum)
Pt states that since starting back on Chlorthalidone Saturday morning his swelling has improved. His blood pressure has been around 117/73, 121/80.  Patient states that Dr. Darlyn Read mentioned something about him going to see cardiology across from [name]. He is agreeable to this if so (Looks like a referral has been placed but an appt has not been made at this time).

## 2020-03-14 NOTE — Telephone Encounter (Signed)
Patient is scheduled with [facility] Cardiology Office :)

## 2020-03-15 NOTE — Telephone Encounter (Signed)
Thank you :)

## 2020-03-18 ENCOUNTER — Other Ambulatory Visit: Payer: Self-pay | Admitting: Family Medicine

## 2020-03-18 DIAGNOSIS — R600 Localized edema: Secondary | ICD-10-CM

## 2020-03-18 MED ORDER — CHLORTHALIDONE 25 MG PO TABS: 25.0000 mg | ORAL_TABLET | Freq: Every day | ORAL | 1 refills | Status: DC

## 2020-03-18 NOTE — Telephone Encounter (Signed)
Ok to refill 

## 2020-03-18 NOTE — Telephone Encounter (Signed)
  Prescription Request  03/18/2020  What is the name of the medication or equipment? Chlorthaidone/ Dr [employer] gave different bp meds and he couldn't take that so Dr Darlyn Read told him to go back to old meds  Have you contacted your pharmacy to request a refill? (if applicable) no  Which pharmacy would you like this sent to? [facility]   Patient notified that their request is being sent to the clinical staff for review and that they should receive a response within 2 business days.

## 2020-03-21 ENCOUNTER — Ambulatory Visit: Payer: BC Managed Care – PPO | Admitting: Physician Assistant

## 2020-03-21 ENCOUNTER — Encounter: Payer: Self-pay | Admitting: Physician Assistant

## 2020-03-21 ENCOUNTER — Other Ambulatory Visit: Payer: Self-pay

## 2020-03-21 VITALS — BP 123/81 | HR 88 | Temp 97.5°F | Resp 20 | Ht 72.0 in | Wt 315.0 lb

## 2020-03-21 DIAGNOSIS — N50812 Left testicular pain: Secondary | ICD-10-CM | POA: Diagnosis not present

## 2020-03-21 NOTE — Patient Instructions (Signed)
Hydrocele, Adult A hydrocele is a collection of fluid in the loose pouch of skin that holds the testicles (scrotum). This may happen because:  The amount of fluid produced in the scrotum is not absorbed by the rest of the body.  Fluid from the abdomen fills the scrotum. Normally, the testicles develop in the abdomen then move (drop) into to the scrotum before birth. The tube that the testicles travel through usually closes after the testicles drop. If the tube does not close, fluid from the abdomen can fill the scrotum. This is less common in adults. What are the causes? The cause of a hydrocele in adults is usually not known. However, it may be caused by:  An injury to the scrotum.  An infection (epididymitis).  Decreased blood flow to the scrotum.  Twisting of a testicle (testicular torsion).  A birth defect.  A tumor or cancer of the testicle. What are the signs or symptoms? A hydrocele feels like a water-filled balloon. It may also feel heavy. Other symptoms include:  Swelling of the scrotum. The swelling may decrease when you lie down. You may also notice more swelling at night than in the morning.  Swelling of the groin.  Mild discomfort in the scrotum.  Pain. This can develop if the hydrocele was caused by infection or twisting. The larger the hydrocele, the more likely you are to have pain. How is this diagnosed? This condition may be diagnosed based on:  Physical exam.  Medical history. You may also have other tests, including:  Imaging tests, such as ultrasound.  Blood or urine tests. How is this treated? Most hydroceles go away on their own. If you have no discomfort or pain, your health care provider may suggest close monitoring of your condition (called watch and wait or watchful waiting) until the condition goes away or symptoms develop. If treatment is needed, it may include:  Treating an underlying condition. This may include using an antibiotic medicine to  treat an infection.  Surgery to stop fluid from collecting in the scrotum.  Surgery to drain the fluid. Options include: ? Needle aspiration. A needle is used to drain fluid. However, the fluid buildup will come back quickly. ? Hydrocelectomy. For this procedure, an incision is made in the scrotum to remove the fluid sac. Follow these instructions at home:  Watch the hydrocele for any changes.  Take over-the-counter and prescription medicines only as told by your health care provider.  If you were prescribed an antibiotic medicine, use it as told by your health care provider. Do not stop taking the antibiotic even if you start to feel better.  Keep all follow-up visits as told by your health care provider. This is important. Contact a health care provider if:  You notice any changes in the hydrocele.  The swelling in your scrotum or groin gets worse.  The hydrocele becomes red, firm, painful, or tender to the touch.  You have a fever. Get help right away if you:  Develop a lot of pain, or your pain becomes worse. Summary  A hydrocele is a collection of fluid in the loose pouch of skin that holds the testicles (scrotum).  Hydroceles can cause swelling, discomfort, and sometimes pain.  In adults, the cause of a hydrocele usually is not known. However, it is sometimes caused by an infection or a rotation and twisting of the scrotum.  Treatment is usually not needed. Hydroceles often go away on their own. If a hydrocele causes pain, treatment   may be given to ease the pain. This information is not intended to replace advice given to you by your health care provider. Make sure you discuss any questions you have with your health care provider. Document Revised: 09/04/2017 Document Reviewed: 09/04/2017 Elsevier Patient Education  2020 [employer]  

## 2020-03-21 NOTE — Progress Notes (Signed)
  Subjective:     Patient ID: Joe Romero, male   DOB: 1966/06/08, 54 y.o.   MRN: [id number]  HPI Pt with fullness to the L testicle/scrotum Sl pain last week No hx of same Denies any urinary sx No hx or FH of hernia  Review of Systems  Constitutional: Negative.   Genitourinary: Positive for testicular pain. Negative for decreased urine volume, difficulty urinating, dysuria, hematuria, penile pain, penile swelling, scrotal swelling and urgency.       Objective:   Physical Exam Vitals and nursing note reviewed.  Constitutional:      Appearance: Normal appearance. He is normal weight.  Genitourinary:    Penis: Normal.      Comments: Fullness noted to the L scrotal area L testicle non tender No definite hernia apprec Neurological:     Mental Status: He is alert.        Assessment:      1. Pain in left testicle        Plan:     Referral to [facility] for eval/US Activities as tol F/U prn

## 2020-04-18 DIAGNOSIS — N401 Enlarged prostate with lower urinary tract symptoms: Secondary | ICD-10-CM | POA: Diagnosis not present

## 2020-04-18 DIAGNOSIS — N433 Hydrocele, unspecified: Secondary | ICD-10-CM | POA: Diagnosis not present

## 2020-04-18 DIAGNOSIS — R350 Frequency of micturition: Secondary | ICD-10-CM | POA: Diagnosis not present

## 2020-04-23 ENCOUNTER — Encounter: Payer: Self-pay | Admitting: Cardiology

## 2020-04-23 DIAGNOSIS — R072 Precordial pain: Secondary | ICD-10-CM | POA: Insufficient documentation

## 2020-04-23 NOTE — Progress Notes (Signed)
Cardiology Office Note   Date:  04/24/2020   ID:  Joe Romero, DOB Aug 29, 1966, MRN [id number]  PCP:  Mechele Claude, MD  Cardiologist:   No primary care provider on file. Referring:  Mechele Claude, MD  Chief Complaint  Patient presents with  . Chest Pain  . Leg Swelling      History of Present Illness: Joe Romero is a 54 y.o. male who was referred by Mechele Claude, MD for evaluation of chest pain.  He has multiple cardiovascular risk factors.  He did have an episode of chest discomfort but he says this was about 4 to 5 years ago.  He said following this he had some weakness for about a month.  He had some palpitations.  He really did not seek cardiovascular care at that time.  Since then he said he is not having any further chest discomfort.  He is lost about 30 pounds through diet.  He has had lower extremity swelling.  This was worse when he was taking a beta-blocker for blood pressure control.  He also had dizziness.  He says since he lost weight his swelling is better.  He did have increased swelling when he was off chlorthalidone.  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  He does not have any neck or arm discomfort with this.  He is not able to bring on chest discomfort with this.  He does not have any PND or orthopnea.  He does have sleep apnea but he thinks he controls this by lying on his side.  He did not wear the mask.  Past Medical History:  Diagnosis Date  . Anxiety   . Back pain   . Dyspnea   . GERD (gastroesophageal reflux disease)   . Hyperlipidemia   . Hypertension   . Sleep apnea    Has CPAP but does not use it.    . Thyroid disease     Past Surgical History:  Procedure Laterality Date  . FINGER SURGERY Right    torn ligament     Current Outpatient Medications  Medication Sig Dispense Refill  . aspirin EC 81 MG tablet Take 1 tablet (81 mg total) by mouth daily. 90 tablet 3  . atorvastatin (LIPITOR) 20 MG tablet Take 1 tablet (20 mg total) by mouth  daily. 90 tablet 3  . chlorthalidone (HYGROTON) 25 MG tablet Take 1 tablet (25 mg total) by mouth daily. 90 tablet 1  . diclofenac (VOLTAREN) 75 MG EC tablet Take 75 mg by mouth 2 (two) times daily.    Marland Kitchen gabapentin (NEURONTIN) 300 MG capsule Take 1 capsule (300 mg total) by mouth 2 (two) times daily. 180 capsule 1  . levothyroxine (SYNTHROID) 125 MCG tablet Take 1 tablet (125 mcg total) by mouth daily. 90 tablet 1  . Magnesium 200 MG TABS Take by mouth daily.     . Multiple Vitamin (MULTIVITAMIN) tablet Take 1 tablet by mouth daily.    . Omega-3 Fatty Acids (FISH OIL PO) Take by mouth daily.     No current facility-administered medications for this visit.    Allergies:   Patient has no known allergies.    Social History:  The patient  reports that he quit smoking about 13 years ago. He has a 20.00 pack-year smoking history. He has never used smokeless tobacco. He reports that he does not drink alcohol and does not use drugs.   Family History:  The patient's family history includes Anxiety disorder  in his mother; Depression in his mother; Diabetes in his father; Hypertension in his father.    ROS:  Please see the history of present illness.   Otherwise, review of systems are positive for none.   All other systems are reviewed and negative.    PHYSICAL EXAM: VS:  BP 122/82   Pulse 82   Ht 6\' 1"  (1.854 m)   Wt (!) 315 lb (142.9 kg)   BMI 41.56 kg/m  , BMI Body mass index is 41.56 kg/m. GENERAL:  Well appearing HEENT:  Pupils equal round and reactive, fundi not visualized, oral mucosa unremarkable NECK:  No jugular venous distention, waveform within normal limits, carotid upstroke brisk and symmetric, no bruits, no thyromegaly LYMPHATICS:  No cervical, inguinal adenopathy LUNGS:  Clear to auscultation bilaterally BACK:  No CVA tenderness CHEST:  Unremarkable HEART:  PMI not displaced or sustained,S1 and S2 within normal limits, no S3, no S4, no clicks, no rubs, no murmurs ABD:   Flat, positive bowel sounds normal in frequency in pitch, no bruits, no rebound, no guarding, no midline pulsatile mass, no hepatomegaly, no splenomegaly EXT:  2 plus pulses throughout, no edema, no cyanosis no clubbing SKIN:  No rashes no nodules NEURO:  Cranial nerves II through XII grossly intact, motor grossly intact throughout PSYCH:  Cognitively intact, oriented to person place and time    EKG:  EKG is ordered today. The ekg ordered today demonstrates sinus rhythm, rate 82, axis within normal intervals within normal limits, no acute ST-T wave changes.   Recent Labs: 02/22/2020: ALT 52; BUN 18; Creatinine, Ser 1.17; Hemoglobin 14.2; Platelets 275; Potassium 3.6; Sodium 140; TSH 9.050    Lipid Panel    Component Value Date/Time   CHOL 163 02/22/2020 1033   TRIG 214 (H) 02/22/2020 1033   HDL 30 (L) 02/22/2020 1033   CHOLHDL 5.4 (H) 02/22/2020 1033   LDLCALC 96 02/22/2020 1033      Wt Readings from Last 3 Encounters:  04/24/20 (!) 315 lb (142.9 kg)  03/21/20 (!) 315 lb (142.9 kg)  02/22/20 (!) 328 lb 6.4 oz (149 kg)      Other studies Reviewed: Additional studies/ records that were reviewed today include: Labs. Review of the above records demonstrates:  Please see elsewhere in the note.     ASSESSMENT AND PLAN:  CHEST PAIN:    His chest pain was atypical.  However, he has significant risk factors. I will bring the patient back for a POET (Plain Old Exercise Test). This will allow me to screen for obstructive coronary disease, risk stratify and very importantly provide a prescription for exercise.  DYSLIPIDEMIA: LDL was 96 with an HDL of 37.  No change in therapy.  I will defer to his primary provider.  HTN: His blood pressure is well controlled.  No change in therapy.  OBESITY: I suggested he reenrolled in the weight loss clinic as he has done well with this and is motivated.    It had been suggested that he might benefit from empagliflozin and I think this would be  reasonable to consider but I will defer to [facility].  COVID EDUCATION: He has not wanted to be vaccinated but we had a long conversation about this.  Current medicines are reviewed at length with the patient today.  The patient does not have concerns regarding medicines.  The following changes have been made:  no change  Labs/ tests ordered today include:   Orders Placed This  Encounter  Procedures  . EXERCISE TOLERANCE TEST (ETT)  . EKG 12-Lead     Disposition:   FU with me as needed     Signed, Rollene Rotunda, MD  04/24/2020 3:48 PM    [facility]

## 2020-04-24 ENCOUNTER — Ambulatory Visit: Payer: BC Managed Care – PPO | Admitting: Cardiology

## 2020-04-24 ENCOUNTER — Other Ambulatory Visit: Payer: Self-pay

## 2020-04-24 ENCOUNTER — Encounter: Payer: Self-pay | Admitting: Cardiology

## 2020-04-24 VITALS — BP 122/82 | HR 82 | Ht 73.0 in | Wt 315.0 lb

## 2020-04-24 DIAGNOSIS — R072 Precordial pain: Secondary | ICD-10-CM

## 2020-04-24 DIAGNOSIS — E785 Hyperlipidemia, unspecified: Secondary | ICD-10-CM

## 2020-04-24 DIAGNOSIS — I10 Essential (primary) hypertension: Secondary | ICD-10-CM

## 2020-04-24 DIAGNOSIS — Z7189 Other specified counseling: Secondary | ICD-10-CM

## 2020-04-24 NOTE — Patient Instructions (Addendum)
Medication Instructions:  The current medical regimen is effective;  continue present plan and medications.  *If you need a refill on your cardiac medications before your next appointment, please call your pharmacy*  Testing/Procedures: Your physician has requested that you have an exercise tolerance test. Please also follow instruction sheet, as given.  Due to recent COVID-19 restrictions implemented by our local and state authorities and in an effort to keep both patients and staff as safe as possible, our hospital system requires COVID-19 testing prior to certain scheduled hospital procedures.  Please go to [address] [facility] [place], [place] [address] on     at    .  This is a drive up testing site.  You will not need to exit your vehicle.  You will not be billed at the time of testing but may receive a bill later depending on your insurance.  The approximate cost of the test is $100.  You must agree to self-quarantine from the time of your testing until the procedure date on    .    This should included staying home with ONLY the people you live with.  Avoid take-out, grocery store shopping or leaving the house for any non-emergent reason.  Failure to have your COVID-19 test done on the date and time you have been scheduled will result in cancellation of your procedure.  Please call our office at [phone] if you have any questions.  Follow-Up: At [facility], you and your health needs are our priority.  As part of our continuing mission to provide you with exceptional heart care, we have created designated Provider Care Teams.  These Care Teams include your primary Cardiologist (physician) and Advanced Practice Providers (APPs -  Physician Assistants and Nurse Practitioners) who all work together to provide you with the care you need, when you need it.  We recommend signing up for the patient portal called "MyChart".  Sign up information is provided on this After Visit Summary.  MyChart is  used to connect with patients for Virtual Visits (Telemedicine).  Patients are able to view lab/test results, encounter notes, upcoming appointments, etc.  Non-urgent messages can be sent to your provider as well.   To learn more about what you can do with MyChart, go to [email or web address].    Follow up will be based on the results of the above testing.  Thank you for choosing [facility]!!

## 2020-04-25 ENCOUNTER — Telehealth: Payer: Self-pay | Admitting: Cardiology

## 2020-04-25 NOTE — Telephone Encounter (Signed)
Spoke with Joe Romero---patient scheduled for ETT on Tuesday 05/07/20 at 2:30 pm at [address]---COVID screening scheduled Saturday 05/04/20 at 9:55 am---will mail information to patient.

## 2020-04-29 ENCOUNTER — Ambulatory Visit: Payer: BC Managed Care – PPO | Admitting: Urology

## 2020-04-30 DIAGNOSIS — N5089 Other specified disorders of the male genital organs: Secondary | ICD-10-CM | POA: Diagnosis not present

## 2020-04-30 DIAGNOSIS — N503 Cyst of epididymis: Secondary | ICD-10-CM | POA: Diagnosis not present

## 2020-05-01 ENCOUNTER — Telehealth: Payer: Self-pay | Admitting: Cardiology

## 2020-05-01 NOTE — Telephone Encounter (Signed)
Joe Romero is calling stating he will be in class from 9-4 on 06/04/20 when he is schedule for his Covid testing prior to his ETT, so he is needing to reschedule. Please advise.

## 2020-05-01 NOTE — Telephone Encounter (Signed)
Pt unable to have his screening done on Sat. Rescheduled him for early Monday 8/30. He understands there is a possibility it will be be resulted and he may have to reschedule his ETT.

## 2020-05-03 ENCOUNTER — Ambulatory Visit: Payer: BC Managed Care – PPO | Admitting: Urology

## 2020-05-04 ENCOUNTER — Other Ambulatory Visit (HOSPITAL_COMMUNITY): Payer: BC Managed Care – PPO

## 2020-05-06 ENCOUNTER — Other Ambulatory Visit (HOSPITAL_COMMUNITY)
Admission: RE | Admit: 2020-05-06 | Discharge: 2020-05-06 | Disposition: A | Discharge status: Home / Self Care | Payer: BC Managed Care – PPO | Source: Ambulatory Visit | Attending: Cardiology | Admitting: Cardiology

## 2020-05-06 DIAGNOSIS — Z20822 Contact with and (suspected) exposure to covid-19: Secondary | ICD-10-CM | POA: Diagnosis not present

## 2020-05-06 DIAGNOSIS — Z01812 Encounter for preprocedural laboratory examination: Secondary | ICD-10-CM | POA: Insufficient documentation

## 2020-05-06 LAB — SARS CORONAVIRUS 2 (TAT 6-24 HRS): SARS Coronavirus 2: NEGATIVE

## 2020-05-07 ENCOUNTER — Other Ambulatory Visit: Payer: Self-pay

## 2020-05-07 ENCOUNTER — Ambulatory Visit (INDEPENDENT_AMBULATORY_CARE_PROVIDER_SITE_OTHER): Payer: BC Managed Care – PPO

## 2020-05-07 DIAGNOSIS — R072 Precordial pain: Secondary | ICD-10-CM

## 2020-05-07 DIAGNOSIS — I10 Essential (primary) hypertension: Secondary | ICD-10-CM

## 2020-05-07 LAB — EXERCISE TOLERANCE TEST
Estimated workload: 7.2 METS
Exercise duration (min): 5 min
Exercise duration (sec): 30 s
MPHR: 168 {beats}/min
Peak HR: 164 {beats}/min
Percent HR: 97 %
RPE: 17
Rest HR: 90 {beats}/min

## 2020-06-06 ENCOUNTER — Encounter: Payer: Self-pay | Admitting: *Deleted

## 2020-07-01 ENCOUNTER — Telehealth: Payer: Self-pay

## 2020-07-01 NOTE — Telephone Encounter (Signed)
OTC meds given to pt

## 2020-07-16 ENCOUNTER — Encounter: Payer: Self-pay | Admitting: *Deleted

## 2020-07-23 ENCOUNTER — Other Ambulatory Visit: Payer: Self-pay | Admitting: Family Medicine

## 2020-07-23 DIAGNOSIS — R600 Localized edema: Secondary | ICD-10-CM

## 2020-08-25 ENCOUNTER — Other Ambulatory Visit: Payer: Self-pay | Admitting: Nurse Practitioner

## 2020-08-25 DIAGNOSIS — E039 Hypothyroidism, unspecified: Secondary | ICD-10-CM

## 2020-08-26 MED ORDER — LEVOTHYROXINE SODIUM 112 MCG PO TABS: 112.0000 ug | ORAL_TABLET | Freq: Every day | ORAL | 0 refills | Status: DC

## 2020-08-26 NOTE — Addendum Note (Signed)
Addended by: Julious Payer D on: 08/26/2020 03:10 PM   Modules accepted: Orders

## 2020-08-26 NOTE — Telephone Encounter (Signed)
TC to pt to clarify dosage, he did not change dose in June, he had run out of the 112 mcg and this is why his levels were out of range. Refill for 112 mcg was sent to pharmacy. Pt has appt with Dr. Darlyn Read on 09/04/20

## 2020-08-27 ENCOUNTER — Ambulatory Visit: Payer: BC Managed Care – PPO | Admitting: Family Medicine

## 2020-09-04 ENCOUNTER — Telehealth: Payer: Self-pay | Admitting: *Deleted

## 2020-09-04 ENCOUNTER — Ambulatory Visit: Payer: BC Managed Care – PPO | Admitting: Family Medicine

## 2020-09-04 ENCOUNTER — Other Ambulatory Visit: Payer: Self-pay

## 2020-09-04 ENCOUNTER — Encounter: Payer: Self-pay | Admitting: Family Medicine

## 2020-09-04 VITALS — BP 127/83 | HR 87 | Temp 97.4°F | Ht 73.0 in | Wt 326.4 lb

## 2020-09-04 DIAGNOSIS — I10 Essential (primary) hypertension: Secondary | ICD-10-CM | POA: Diagnosis not present

## 2020-09-04 DIAGNOSIS — G629 Polyneuropathy, unspecified: Secondary | ICD-10-CM

## 2020-09-04 DIAGNOSIS — E66813 Obesity, class 3: Secondary | ICD-10-CM

## 2020-09-04 DIAGNOSIS — E039 Hypothyroidism, unspecified: Secondary | ICD-10-CM | POA: Diagnosis not present

## 2020-09-04 DIAGNOSIS — E782 Mixed hyperlipidemia: Secondary | ICD-10-CM | POA: Diagnosis not present

## 2020-09-04 DIAGNOSIS — R600 Localized edema: Secondary | ICD-10-CM | POA: Diagnosis not present

## 2020-09-04 DIAGNOSIS — Z6841 Body Mass Index (BMI) 40.0 and over, adult: Secondary | ICD-10-CM

## 2020-09-04 DIAGNOSIS — R7309 Other abnormal glucose: Secondary | ICD-10-CM

## 2020-09-04 DIAGNOSIS — Z1211 Encounter for screening for malignant neoplasm of colon: Secondary | ICD-10-CM

## 2020-09-04 MED ORDER — DICLOFENAC SODIUM 75 MG PO TBEC: 75.0000 mg | DELAYED_RELEASE_TABLET | Freq: Two times a day (BID) | ORAL | 1 refills | Status: DC

## 2020-09-04 MED ORDER — ATORVASTATIN CALCIUM 20 MG PO TABS: 20.0000 mg | ORAL_TABLET | Freq: Every day | ORAL | 3 refills | Status: DC

## 2020-09-04 MED ORDER — LEVOTHYROXINE SODIUM 112 MCG PO TABS: 112.0000 ug | ORAL_TABLET | Freq: Every day | ORAL | 1 refills | Status: DC

## 2020-09-04 MED ORDER — CHLORTHALIDONE 25 MG PO TABS: 25.0000 mg | ORAL_TABLET | Freq: Every day | ORAL | 1 refills | Status: DC

## 2020-09-04 MED ORDER — GABAPENTIN 300 MG PO CAPS
300.0000 mg | ORAL_CAPSULE | Freq: Two times a day (BID) | ORAL | 1 refills | Status: DC
Start: 2020-09-04 — End: 2021-03-05

## 2020-09-04 MED ORDER — OZEMPIC (0.25 OR 0.5 MG/DOSE) 2 MG/1.5ML ~~LOC~~ SOPN: 0.5000 mg | PEN_INJECTOR | SUBCUTANEOUS | 2 refills | Status: DC

## 2020-09-04 NOTE — Telephone Encounter (Signed)
PA for Ozempic came in today  (Key: [id number]) Form filled out and faxed to plan

## 2020-09-04 NOTE — Progress Notes (Signed)
Subjective:  Patient ID: Joe Romero, male    DOB: [birth year]  Age: 54 y.o. MRN: [id number]  CC: Medical Management of Chronic Issues (6 MTH FOLLOW UP, FASTING, NO CONCERNS ) and Hypothyroidism (Has been with out med for 3 weeks )   HPI Joe Romero presents for  follow-up of hypertension. Patient has no history of headache chest pain or shortness of breath or recent cough. Patient also denies symptoms of TIA such as focal numbness or weakness. Patient denies side effects from medication. States taking it regularly.  Patient presents for follow-up on  thyroid. The patient has a history of hypothyroidism for many years. It has been stable recently. Pt. denies any change in  voice, loss of hair, heat or cold intolerance. Energy level has been adequate to good. Patient denies constipation and diarrhea. No myxedema. Medication is as noted below. Verified that pt is taking it daily on an empty stomach.  He does tend to run out of it from time to time due to lack of adequate supply.  Well tolerated.  Patient is interested in weight loss since his BMI is 43.  His wife uses Ozempic for her diabetes.  He is interested in trying that himself. Depression screen Joe Romero 2/9 09/04/2020 03/21/2020 02/22/2020 07/19/2019 07/19/2019  Decreased Interest 0 0 0 0 0  Down, Depressed, Hopeless 0 0 0 0 0  PHQ - 2 Score 0 0 0 0 0  Altered sleeping - - - - -  Tired, decreased energy - - - - -  Change in appetite - - - - -  Feeling bad or failure about yourself  - - - - -  Trouble concentrating - - - - -  Moving slowly or fidgety/restless - - - - -  Suicidal thoughts - - - - -  PHQ-9 Score - - - - -  Difficult doing work/chores - - - - -    History Joe Romero has a past medical history of Anxiety, Back pain, Dyspnea, GERD (gastroesophageal reflux disease), Hyperlipidemia, Hypertension, Sleep apnea, and Thyroid disease.   He has a past surgical history that includes Finger surgery (Right).   His family history includes  Anxiety disorder in his mother; Depression in his mother; Diabetes in his father; Hypertension in his father.He reports that he quit smoking about 13 years ago. He has a 20.00 pack-year smoking history. He has never used smokeless tobacco. He reports that he does not drink alcohol and does not use drugs.  Current Outpatient Medications on File Prior to Visit  Medication Sig Dispense Refill   aspirin EC 81 MG tablet Take 1 tablet (81 mg total) by mouth daily. 90 tablet 3   Magnesium 200 MG TABS Take by mouth daily.      Multiple Vitamin (MULTIVITAMIN) tablet Take 1 tablet by mouth daily.     No current facility-administered medications on file prior to visit.    ROS Review of Systems  Constitutional: Negative for fever.  Respiratory: Negative for shortness of breath.   Cardiovascular: Negative for chest pain.  Musculoskeletal: Negative for arthralgias.  Skin: Negative for rash.    Objective:  BP 127/83    Pulse 87    Temp (!) 97.4 F (36.3 C) (Temporal)    Ht _0  (1.854 m)    Wt (!) 326 lb 6.4 oz (148.1 kg)    BMI 43.06 kg/m   BP Readings from Last 3 Encounters:  09/04/20 127/83  04/24/20 122/82  03/21/20 123/81  Wt Readings from Last 3 Encounters:  09/04/20 (!) 326 lb 6.4 oz (148.1 kg)  04/24/20 (!) 315 lb (142.9 kg)  03/21/20 (!) 315 lb (142.9 kg)     Physical Exam Vitals reviewed.  Constitutional:      Appearance: He is well-developed and well-nourished.  HENT:     Head: Normocephalic and atraumatic.     Right Ear: Tympanic membrane and external ear normal. No decreased hearing noted.     Left Ear: Tympanic membrane and external ear normal. No decreased hearing noted.     Mouth/Throat:     Pharynx: No oropharyngeal exudate or posterior oropharyngeal erythema.  Eyes:     Pupils: Pupils are equal, round, and reactive to light.  Cardiovascular:     Rate and Rhythm: Normal rate and regular rhythm.     Heart sounds: No murmur heard.   Pulmonary:     Effort:  No respiratory distress.     Breath sounds: Normal breath sounds.  Abdominal:     General: Bowel sounds are normal.     Palpations: Abdomen is soft. There is no mass.     Tenderness: There is no abdominal tenderness.  Musculoskeletal:     Cervical back: Normal range of motion and neck supple.       Assessment & Plan:   Suleiman was seen today for medical management of chronic issues and hypothyroidism.  Diagnoses and all orders for this visit:  Essential hypertension -     Thyroid Panel With TSH -     CMP14+EGFR -     Lipid panel  Acquired hypothyroidism -     levothyroxine (SYNTHROID) 112 MCG tablet; Take 1 tablet (112 mcg total) by mouth daily. -     Thyroid Panel With TSH -     CMP14+EGFR -     Lipid panel  Localized edema -     chlorthalidone (HYGROTON) 25 MG tablet; Take 1 tablet (25 mg total) by mouth daily. -     Thyroid Panel With TSH -     CMP14+EGFR -     Lipid panel  Mixed hyperlipidemia -     atorvastatin (LIPITOR) 20 MG tablet; Take 1 tablet (20 mg total) by mouth daily. -     Thyroid Panel With TSH -     CMP14+EGFR -     Lipid panel  Neuropathy -     gabapentin (NEURONTIN) 300 MG capsule; Take 1 capsule (300 mg total) by mouth 2 (two) times daily. -     Thyroid Panel With TSH -     CMP14+EGFR -     Lipid panel  Screen for colon cancer -     Cologuard  Class 3 severe obesity due to excess calories with serious comorbidity and body mass index (BMI) of 40.0 to 44.9 in adult Joe Romero)  Other orders -     diclofenac (VOLTAREN) 75 MG EC tablet; Take 1 tablet (75 mg total) by mouth 2 (two) times daily. -     Semaglutide,0.25 or 0.5MG/DOS, (OZEMPIC, 0.25 OR 0.5 MG/DOSE,) 2 MG/1.5ML SOPN; Inject 0.5 mg into the skin once a week.   Allergies as of 09/04/2020   No Known Allergies     Medication List       Accurate as of September 04, 2020  8:38 PM. If you have any questions, ask your nurse or doctor.        STOP taking these medications   FISH OIL  PO Stopped by: Claretta Fraise, MD  TAKE these medications   aspirin EC 81 MG tablet Take 1 tablet (81 mg total) by mouth daily.   atorvastatin 20 MG tablet Commonly known as: LIPITOR Take 1 tablet (20 mg total) by mouth daily.   chlorthalidone 25 MG tablet Commonly known as: HYGROTON Take 1 tablet (25 mg total) by mouth daily.   diclofenac 75 MG EC tablet Commonly known as: VOLTAREN Take 1 tablet (75 mg total) by mouth 2 (two) times daily.   gabapentin 300 MG capsule Commonly known as: NEURONTIN Take 1 capsule (300 mg total) by mouth 2 (two) times daily.   levothyroxine 112 MCG tablet Commonly known as: SYNTHROID Take 1 tablet (112 mcg total) by mouth daily.   Magnesium 200 MG Tabs Take by mouth daily.   multivitamin tablet Take 1 tablet by mouth daily.   Ozempic (0.25 or 0.5 MG/DOSE) 2 MG/1.5ML Sopn Generic drug: Semaglutide(0.25 or 0.5MG/DOS) Inject 0.5 mg into the skin once a week. Started by: Claretta Fraise, MD       Meds ordered this encounter  Medications   levothyroxine (SYNTHROID) 112 MCG tablet    Sig: Take 1 tablet (112 mcg total) by mouth daily.    Dispense:  90 tablet    Refill:  1   chlorthalidone (HYGROTON) 25 MG tablet    Sig: Take 1 tablet (25 mg total) by mouth daily.    Dispense:  90 tablet    Refill:  1   atorvastatin (LIPITOR) 20 MG tablet    Sig: Take 1 tablet (20 mg total) by mouth daily.    Dispense:  90 tablet    Refill:  3   gabapentin (NEURONTIN) 300 MG capsule    Sig: Take 1 capsule (300 mg total) by mouth 2 (two) times daily.    Dispense:  180 capsule    Refill:  1   diclofenac (VOLTAREN) 75 MG EC tablet    Sig: Take 1 tablet (75 mg total) by mouth 2 (two) times daily.    Dispense:  180 tablet    Refill:  1   Semaglutide,0.25 or 0.5MG/DOS, (OZEMPIC, 0.25 OR 0.5 MG/DOSE,) 2 MG/1.5ML SOPN    Sig: Inject 0.5 mg into the skin once a week.    Dispense:  1.5 mL    Refill:  2     Semaglutide was ordered for him.  This  can be substituted with Trulicity if his insurance will cover one better than the other.  At this time he does have morbid obesity and is at high risk for multiple medical conditions including complications of his current blood pressure.  This could lead to heart attack and stroke.  He is at high risk for diabetes as well.  As result I highly encouraged a 100+ pound weight loss over 1 to 2 years.  He will give the Ozempic a try.   Follow-up: No follow-ups on file.  Claretta Fraise, M.D.

## 2020-09-05 DIAGNOSIS — R7309 Other abnormal glucose: Secondary | ICD-10-CM | POA: Diagnosis not present

## 2020-09-05 LAB — BAYER DCA HB A1C WAIVED: HB A1C (BAYER DCA - WAIVED): 5.4 % (ref <7.0)

## 2020-09-05 LAB — CMP14+EGFR
ALT: 48 IU/L — ABNORMAL HIGH (ref 0–44)
AST: 27 IU/L (ref 0–40)
Albumin/Globulin Ratio: 1.8 (ref 1.2–2.2)
Albumin: 4.5 g/dL (ref 3.8–4.9)
Alkaline Phosphatase: 66 IU/L (ref 44–121)
BUN/Creatinine Ratio: 17 (ref 9–20)
BUN: 20 mg/dL (ref 6–24)
Bilirubin Total: 0.4 mg/dL (ref 0.0–1.2)
CO2: 26 mmol/L (ref 20–29)
Calcium: 9.4 mg/dL (ref 8.7–10.2)
Chloride: 100 mmol/L (ref 96–106)
Creatinine, Ser: 1.16 mg/dL (ref 0.76–1.27)
GFR calc Af Amer: 83 mL/min/{1.73_m2} (ref >59)
GFR calc non Af Amer: 71 mL/min/{1.73_m2} (ref >59)
Globulin, Total: 2.5 g/dL (ref 1.5–4.5)
Glucose: 94 mg/dL (ref 65–99)
Potassium: 4.8 mmol/L (ref 3.5–5.2)
Sodium: 140 mmol/L (ref 134–144)
Total Protein: 7 g/dL (ref 6.0–8.5)

## 2020-09-05 LAB — THYROID PANEL WITH TSH
Free Thyroxine Index: 1.4 (ref 1.2–4.9)
T3 Uptake Ratio: 21 % — ABNORMAL LOW (ref 24–39)
T4, Total: 6.6 ug/dL (ref 4.5–12.0)
TSH: 15.8 u[IU]/mL — ABNORMAL HIGH (ref 0.450–4.500)

## 2020-09-05 LAB — LIPID PANEL
Chol/HDL Ratio: 5.4 ratio — ABNORMAL HIGH (ref 0.0–5.0)
Cholesterol, Total: 194 mg/dL (ref 100–199)
HDL: 36 mg/dL — ABNORMAL LOW (ref >39)
LDL Chol Calc (NIH): 131 mg/dL — ABNORMAL HIGH (ref 0–99)
Triglycerides: 149 mg/dL (ref 0–149)
VLDL Cholesterol Cal: 27 mg/dL (ref 5–40)

## 2020-09-05 NOTE — Addendum Note (Signed)
Addended by: Lorelee Cover C on: 09/05/2020 03:11 PM   Modules accepted: Orders

## 2020-09-05 NOTE — Telephone Encounter (Signed)
See if lab can add an A1c to the blood work that was done yesterday. If it comes out elevated we might be able to get it covered under diabetes.

## 2020-09-05 NOTE — Telephone Encounter (Signed)
Add on sheet taken to lab 

## 2020-09-05 NOTE — Telephone Encounter (Signed)
Ozempic is not covered by pt plan.  Anti-Obesity is not covered per plan.

## 2020-09-12 ENCOUNTER — Telehealth: Payer: Self-pay | Admitting: Family Medicine

## 2020-09-12 NOTE — Telephone Encounter (Signed)
Pt calling about ozempic, said that it was not being filled at the pharmacy.

## 2020-09-12 NOTE — Telephone Encounter (Signed)
According to notes in chart, prior authorization is required for Ozempic and is in process.  Patient informed.

## 2020-09-13 ENCOUNTER — Other Ambulatory Visit: Payer: Self-pay

## 2020-09-13 DIAGNOSIS — E039 Hypothyroidism, unspecified: Secondary | ICD-10-CM

## 2021-03-05 ENCOUNTER — Ambulatory Visit (INDEPENDENT_AMBULATORY_CARE_PROVIDER_SITE_OTHER): Payer: BC Managed Care – PPO | Admitting: Family Medicine

## 2021-03-05 ENCOUNTER — Telehealth: Payer: Self-pay | Admitting: *Deleted

## 2021-03-05 ENCOUNTER — Other Ambulatory Visit: Payer: Self-pay

## 2021-03-05 ENCOUNTER — Encounter: Payer: Self-pay | Admitting: Family Medicine

## 2021-03-05 VITALS — BP 126/87 | HR 86 | Temp 97.3°F | Ht 73.0 in | Wt 324.0 lb

## 2021-03-05 DIAGNOSIS — R600 Localized edema: Secondary | ICD-10-CM | POA: Diagnosis not present

## 2021-03-05 DIAGNOSIS — E8881 Metabolic syndrome: Secondary | ICD-10-CM

## 2021-03-05 DIAGNOSIS — E039 Hypothyroidism, unspecified: Secondary | ICD-10-CM

## 2021-03-05 DIAGNOSIS — Z0001 Encounter for general adult medical examination with abnormal findings: Secondary | ICD-10-CM | POA: Diagnosis not present

## 2021-03-05 DIAGNOSIS — E782 Mixed hyperlipidemia: Secondary | ICD-10-CM

## 2021-03-05 DIAGNOSIS — G629 Polyneuropathy, unspecified: Secondary | ICD-10-CM | POA: Diagnosis not present

## 2021-03-05 DIAGNOSIS — Z0000 Encounter for general adult medical examination without abnormal findings: Secondary | ICD-10-CM

## 2021-03-05 DIAGNOSIS — I10 Essential (primary) hypertension: Secondary | ICD-10-CM

## 2021-03-05 MED ORDER — ATORVASTATIN CALCIUM 20 MG PO TABS: 20.0000 mg | ORAL_TABLET | Freq: Every day | ORAL | 3 refills | Status: DC

## 2021-03-05 MED ORDER — CHLORTHALIDONE 25 MG PO TABS: 25.0000 mg | ORAL_TABLET | Freq: Every day | ORAL | 3 refills | Status: DC

## 2021-03-05 MED ORDER — LEVOTHYROXINE SODIUM 112 MCG PO TABS: 112.0000 ug | ORAL_TABLET | Freq: Every day | ORAL | 3 refills | Status: DC

## 2021-03-05 MED ORDER — DICLOFENAC SODIUM 75 MG PO TBEC: 75.0000 mg | DELAYED_RELEASE_TABLET | Freq: Two times a day (BID) | ORAL | 3 refills | Status: DC

## 2021-03-05 MED ORDER — TRULICITY 0.75 MG/0.5ML ~~LOC~~ SOAJ: 0.7500 mg | SUBCUTANEOUS | 2 refills | Status: DC

## 2021-03-05 MED ORDER — GABAPENTIN 300 MG PO CAPS: 300.0000 mg | ORAL_CAPSULE | Freq: Two times a day (BID) | ORAL | 3 refills | Status: DC

## 2021-03-05 NOTE — Progress Notes (Addendum)
Subjective:  Patient ID: Joe Romero, male    DOB: October 17, 1965  Age: 55 y.o. MRN: [id number]  CC: No chief complaint on file.   HPI Joe Romero presents for follow-up and annual physical examination.  He is followed for high blood pressure.  He is also followed for above for his thyroid condition.  He is very concerned about his weight and would like to start taking off significant mount of weight he wonders what options he might have.  PT. Has metabolic syndrome as well due to abd girth, weight, triglycerides >150, HDL <40 and hypertension  Depression screen [facility] 2/9 03/05/2021 09/04/2020 03/21/2020  Decreased Interest 0 0 0  Down, Depressed, Hopeless 0 0 0  PHQ - 2 Score 0 0 0  Altered sleeping 0 - -  Tired, decreased energy 3 - -  Change in appetite 3 - -  Feeling bad or failure about yourself  0 - -  Trouble concentrating 0 - -  Moving slowly or fidgety/restless 0 - -  Suicidal thoughts 0 - -  PHQ-9 Score 6 - -  Difficult doing work/chores Not difficult at all - -    History Joe Romero has a past medical history of Anxiety, Back pain, Dyspnea, GERD (gastroesophageal reflux disease), Hyperlipidemia, Hypertension, Sleep apnea, and Thyroid disease.   He has a past surgical history that includes Finger surgery (Right).   His family history includes Anxiety disorder in his mother; Depression in his mother; Diabetes in his father; Hypertension in his father.He reports that he quit smoking about 14 years ago. He has a 20.00 pack-year smoking history. He has never used smokeless tobacco. He reports that he does not drink alcohol and does not use drugs.    ROS Review of Systems  Constitutional:  Negative for activity change, fatigue and unexpected weight change.  HENT:  Negative for congestion, ear pain, hearing loss, postnasal drip and trouble swallowing.   Eyes:  Negative for pain and visual disturbance.  Respiratory:  Negative for cough, chest tightness and shortness of breath.    Cardiovascular:  Negative for chest pain, palpitations and leg swelling.  Gastrointestinal:  Negative for abdominal distention, abdominal pain, blood in stool, constipation, diarrhea, nausea and vomiting.  Endocrine: Negative for cold intolerance, heat intolerance and polydipsia.  Genitourinary:  Negative for difficulty urinating, dysuria, flank pain, frequency and urgency.  Musculoskeletal:  Negative for arthralgias and joint swelling.  Skin:  Negative for color change, rash and wound.  Neurological:  Negative for dizziness, syncope, speech difficulty, weakness, light-headedness, numbness and headaches.  Hematological:  Does not bruise/bleed easily.  Psychiatric/Behavioral:  Negative for confusion, decreased concentration, dysphoric mood and sleep disturbance. The patient is not nervous/anxious.    Objective:  BP 126/87   Pulse 86   Temp (!) 97.3 F (36.3 C)   Ht 6' 1"  (1.854 m)   Wt (!) 324 lb (147 kg)   SpO2 97%   BMI 42.75 kg/m   BP Readings from Last 3 Encounters:  03/05/21 126/87  09/04/20 127/83  04/24/20 122/82    Wt Readings from Last 3 Encounters:  03/05/21 (!) 324 lb (147 kg)  09/04/20 (!) 326 lb 6.4 oz (148.1 kg)  04/24/20 (!) 315 lb (142.9 kg)     Physical Exam Constitutional:      Appearance: He is well-developed. He is obese.  HENT:     Head: Normocephalic and atraumatic.  Eyes:     Pupils: Pupils are equal, round, and reactive to light.  Neck:  Thyroid: No thyromegaly.     Trachea: No tracheal deviation.  Cardiovascular:     Rate and Rhythm: Normal rate and regular rhythm.     Heart sounds: Normal heart sounds. No murmur heard.   No friction rub. No gallop.  Pulmonary:     Breath sounds: Normal breath sounds. No wheezing or rales.  Abdominal:     General: Bowel sounds are normal. There is no distension.     Palpations: Abdomen is soft. There is no mass.     Tenderness: There is no abdominal tenderness.     Hernia: There is no hernia in the  left inguinal area.  Genitourinary:    Penis: Normal.      Testes: Normal.  Musculoskeletal:        General: Normal range of motion.     Cervical back: Normal range of motion.  Lymphadenopathy:     Cervical: No cervical adenopathy.  Skin:    General: Skin is warm and dry.  Neurological:     Mental Status: He is alert and oriented to person, place, and time.      Assessment & Plan:   Diagnoses and all orders for this visit:  Well adult exam  Mixed hyperlipidemia -     atorvastatin (LIPITOR) 20 MG tablet; Take 1 tablet (20 mg total) by mouth daily. -     Lipid panel  Localized edema -     chlorthalidone (HYGROTON) 25 MG tablet; Take 1 tablet (25 mg total) by mouth daily.  Neuropathy -     gabapentin (NEURONTIN) 300 MG capsule; Take 1 capsule (300 mg total) by mouth 2 (two) times daily.  Acquired hypothyroidism -     levothyroxine (SYNTHROID) 112 MCG tablet; Take 1 tablet (112 mcg total) by mouth daily. -     TSH + free T4  Essential hypertension -     CBC with Differential/Platelet -     XBW62+MBTD  Metabolic syndrome -     Dulaglutide (TRULICITY) 9.74 BU/3.8GT SOPN; Inject 0.75 mg into the skin once a week.  Other orders -     diclofenac (VOLTAREN) 75 MG EC tablet; Take 1 tablet (75 mg total) by mouth 2 (two) times daily.  Is going to try Trulicity.  If that does not work out we can go to the phentermine and topiramate option as a backup.  He understands that he also has to commit to a strict low calorie diet with regular exercise as well.    I have discontinued Joe Romero's multivitamin and Magnesium. I am also having him start on Trulicity. Additionally, I am having him maintain his aspirin EC, atorvastatin, chlorthalidone, diclofenac, gabapentin, and levothyroxine.  Allergies as of 03/05/2021   No Known Allergies      Medication List        Accurate as of March 05, 2021 11:59 PM. If you have any questions, ask your nurse or doctor.          STOP  taking these medications    Magnesium 200 MG Tabs Stopped by: Claretta Fraise, MD   multivitamin tablet Stopped by: Claretta Fraise, MD   Ozempic (0.25 or 0.5 MG/DOSE) 2 MG/1.5ML Sopn Generic drug: Semaglutide(0.25 or 0.5MG/DOS) Stopped by: Huntley Dec, LPN       TAKE these medications    aspirin EC 81 MG tablet Take 1 tablet (81 mg total) by mouth daily.   atorvastatin 20 MG tablet Commonly known as: LIPITOR Take 1 tablet (20 mg total) by  mouth daily.   chlorthalidone 25 MG tablet Commonly known as: HYGROTON Take 1 tablet (25 mg total) by mouth daily.   diclofenac 75 MG EC tablet Commonly known as: VOLTAREN Take 1 tablet (75 mg total) by mouth 2 (two) times daily.   gabapentin 300 MG capsule Commonly known as: NEURONTIN Take 1 capsule (300 mg total) by mouth 2 (two) times daily.   levothyroxine 112 MCG tablet Commonly known as: SYNTHROID Take 1 tablet (112 mcg total) by mouth daily.   Trulicity 4.57 NR/4.4ET Sopn Generic drug: Dulaglutide Inject 0.75 mg into the skin once a week. Started by: Claretta Fraise, MD         Follow-up: Return in about 6 weeks (around 04/16/2021).  Claretta Fraise, M.D.

## 2021-03-05 NOTE — Telephone Encounter (Signed)
Joe Romero (Key: [id number]) Trulicity 0.75MG /0.5ML pen-injectors Online form filled out and sent/faxed to plan

## 2021-03-06 LAB — CMP14+EGFR
ALT: 39 IU/L (ref 0–44)
AST: 24 IU/L (ref 0–40)
Albumin/Globulin Ratio: 1.9 (ref 1.2–2.2)
Albumin: 4.5 g/dL (ref 3.8–4.9)
Alkaline Phosphatase: 71 IU/L (ref 44–121)
BUN/Creatinine Ratio: 16 (ref 9–20)
BUN: 21 mg/dL (ref 6–24)
Bilirubin Total: 0.5 mg/dL (ref 0.0–1.2)
CO2: 25 mmol/L (ref 20–29)
Calcium: 9.3 mg/dL (ref 8.7–10.2)
Chloride: 99 mmol/L (ref 96–106)
Creatinine, Ser: 1.32 mg/dL — ABNORMAL HIGH (ref 0.76–1.27)
Globulin, Total: 2.4 g/dL (ref 1.5–4.5)
Glucose: 102 mg/dL — ABNORMAL HIGH (ref 65–99)
Potassium: 3.7 mmol/L (ref 3.5–5.2)
Sodium: 140 mmol/L (ref 134–144)
Total Protein: 6.9 g/dL (ref 6.0–8.5)
eGFR: 64 mL/min/{1.73_m2} (ref >59)

## 2021-03-06 LAB — CBC WITH DIFFERENTIAL/PLATELET
Basophils Absolute: 0.1 10*3/uL (ref 0.0–0.2)
Basos: 1 %
EOS (ABSOLUTE): 0.2 10*3/uL (ref 0.0–0.4)
Eos: 2 %
Hematocrit: 43.8 % (ref 37.5–51.0)
Hemoglobin: 14.6 g/dL (ref 13.0–17.7)
Immature Grans (Abs): 0.1 10*3/uL (ref 0.0–0.1)
Immature Granulocytes: 1 %
Lymphocytes Absolute: 2.8 10*3/uL (ref 0.7–3.1)
Lymphs: 26 %
MCH: 30.5 pg (ref 26.6–33.0)
MCHC: 33.3 g/dL (ref 31.5–35.7)
MCV: 91 fL (ref 79–97)
Monocytes Absolute: 0.8 10*3/uL (ref 0.1–0.9)
Monocytes: 8 %
Neutrophils Absolute: 6.7 10*3/uL (ref 1.4–7.0)
Neutrophils: 62 %
Platelets: 276 10*3/uL (ref 150–450)
RBC: 4.79 x10E6/uL (ref 4.14–5.80)
RDW: 13.4 % (ref 11.6–15.4)
WBC: 10.7 10*3/uL (ref 3.4–10.8)

## 2021-03-06 LAB — LIPID PANEL
Chol/HDL Ratio: 5.2 ratio — ABNORMAL HIGH (ref 0.0–5.0)
Cholesterol, Total: 172 mg/dL (ref 100–199)
HDL: 33 mg/dL — ABNORMAL LOW (ref >39)
LDL Chol Calc (NIH): 107 mg/dL — ABNORMAL HIGH (ref 0–99)
Triglycerides: 185 mg/dL — ABNORMAL HIGH (ref 0–149)
VLDL Cholesterol Cal: 32 mg/dL (ref 5–40)

## 2021-03-06 LAB — TSH+FREE T4
Free T4: 1.03 ng/dL (ref 0.82–1.77)
TSH: 9.41 u[IU]/mL — ABNORMAL HIGH (ref 0.450–4.500)

## 2021-03-07 ENCOUNTER — Other Ambulatory Visit: Payer: Self-pay | Admitting: Family Medicine

## 2021-03-07 DIAGNOSIS — E039 Hypothyroidism, unspecified: Secondary | ICD-10-CM

## 2021-03-07 MED ORDER — LEVOTHYROXINE SODIUM 137 MCG PO TABS: 137.0000 ug | ORAL_TABLET | Freq: Every day | ORAL | 3 refills | Status: DC

## 2021-03-07 NOTE — Telephone Encounter (Signed)
Based on his lab results, I was able to add the diagnosis of metabolic syndrome.  Raynelle Fanning tells me that most insurances cover for this diagnosis.  Please try again using metabolic syndrome as the diagnosis for the medication Trulicity.  Thanks.

## 2021-03-07 NOTE — Telephone Encounter (Signed)
Denied. Anti-obesity not covered per plan parameters.

## 2021-03-17 NOTE — Telephone Encounter (Signed)
Patient would like to go ahead and try phentermine like you suggested if these were unable to be approved.

## 2021-03-17 NOTE — Telephone Encounter (Signed)
Resent to plan today with dx: metabolic syndrome  (Key: [id number])-Trulicity

## 2021-03-18 ENCOUNTER — Other Ambulatory Visit: Payer: Self-pay | Admitting: Family Medicine

## 2021-03-18 MED ORDER — PHENTERMINE HCL 37.5 MG PO CAPS
37.5000 mg | ORAL_CAPSULE | ORAL | 2 refills | Status: DC
Start: 2021-03-18 — End: 2021-08-18

## 2021-03-18 MED ORDER — TOPIRAMATE 25 MG PO TABS: 25.0000 mg | ORAL_TABLET | Freq: Every day | ORAL | 2 refills | Status: DC

## 2021-03-18 NOTE — Telephone Encounter (Signed)
Lmtcb.

## 2021-03-18 NOTE — Telephone Encounter (Signed)
I sent in phentermine and topiramate. The combination works better than either alone. Take phentermine in the morning and topiramate at bedtime.

## 2021-03-19 NOTE — Telephone Encounter (Signed)
TRULICITY DENIED

## 2021-05-26 ENCOUNTER — Other Ambulatory Visit: Payer: Self-pay | Admitting: Family Medicine

## 2021-05-26 DIAGNOSIS — R600 Localized edema: Secondary | ICD-10-CM

## 2021-08-03 ENCOUNTER — Other Ambulatory Visit: Payer: Self-pay | Admitting: Family Medicine

## 2021-08-03 DIAGNOSIS — I10 Essential (primary) hypertension: Secondary | ICD-10-CM

## 2021-08-04 ENCOUNTER — Other Ambulatory Visit: Payer: Self-pay | Admitting: Family Medicine

## 2021-08-18 ENCOUNTER — Ambulatory Visit: Payer: BC Managed Care – PPO | Admitting: Family Medicine

## 2021-08-18 ENCOUNTER — Encounter: Payer: Self-pay | Admitting: Family Medicine

## 2021-08-18 VITALS — BP 125/82 | HR 89 | Temp 97.3°F | Ht 73.0 in | Wt 303.2 lb

## 2021-08-18 DIAGNOSIS — R600 Localized edema: Secondary | ICD-10-CM | POA: Diagnosis not present

## 2021-08-18 DIAGNOSIS — E782 Mixed hyperlipidemia: Secondary | ICD-10-CM | POA: Diagnosis not present

## 2021-08-18 DIAGNOSIS — I10 Essential (primary) hypertension: Secondary | ICD-10-CM | POA: Diagnosis not present

## 2021-08-18 DIAGNOSIS — E039 Hypothyroidism, unspecified: Secondary | ICD-10-CM

## 2021-08-18 MED ORDER — PHENTERMINE HCL 37.5 MG PO CAPS
37.5000 mg | ORAL_CAPSULE | ORAL | 2 refills | Status: DC
Start: 2021-08-18 — End: 2022-02-16

## 2021-08-18 MED ORDER — CHLORTHALIDONE 25 MG PO TABS: 25.0000 mg | ORAL_TABLET | Freq: Every day | ORAL | 3 refills | Status: DC

## 2021-08-18 MED ORDER — CLOTRIMAZOLE-BETAMETHASONE 1-0.05 % EX CREA: 1.0000 "application " | TOPICAL_CREAM | Freq: Two times a day (BID) | CUTANEOUS | 1 refills | Status: DC

## 2021-08-18 NOTE — Progress Notes (Signed)
Subjective:  Patient ID: Joe Romero, male    DOB: 01-Mar-1966  Age: 55 y.o. MRN: [id number]  CC: Medical Management of Chronic Issues   HPI Joe Romero presents for  follow-up of hypertension. Patient has no history of headache chest pain or shortness of breath or recent cough. Patient also denies symptoms of TIA such as focal numbness or weakness. Patient denies side effects from medication. States taking it regularly.   follow-up on  thyroid. The patient has a history of hypothyroidism for many years. It has been stable recently. Pt. denies any change in  voice, loss of hair, heat or cold intolerance. Energy level has been adequate to good. Patient denies constipation and diarrhea. No myxedema. Medication is as noted below. Verified that pt is taking it daily on an empty stomach. Well tolerated.   in for follow-up of elevated cholesterol. Doing well without complaints on current medication. Denies side effects of statin including myalgia and arthralgia and nausea. Currently no chest pain, shortness of breath or other cardiovascular related symptoms noted.  History Joe Romero has a past medical history of Anxiety, Back pain, Dyspnea, GERD (gastroesophageal reflux disease), Hyperlipidemia, Hypertension, Sleep apnea, and Thyroid disease.   He has a past surgical history that includes Finger surgery (Right).   His family history includes Anxiety disorder in his mother; Depression in his mother; Diabetes in his father; Hypertension in his father.He reports that he quit smoking about 14 years ago. His smoking use included cigarettes. He has a 20.00 pack-year smoking history. He has never used smokeless tobacco. He reports that he does not drink alcohol and does not use drugs.  Current Outpatient Medications on File Prior to Visit  Medication Sig Dispense Refill   atorvastatin (LIPITOR) 20 MG tablet Take 1 tablet (20 mg total) by mouth daily. 90 tablet 3   diclofenac (VOLTAREN) 75 MG EC tablet Take 1  tablet (75 mg total) by mouth 2 (two) times daily. 180 tablet 3   EQ ASPIRIN ADULT LOW DOSE 81 MG EC tablet Take 1 tablet by mouth once daily 90 tablet 3   gabapentin (NEURONTIN) 300 MG capsule Take 1 capsule (300 mg total) by mouth 2 (two) times daily. 180 capsule 3   levothyroxine (SYNTHROID) 137 MCG tablet Take 1 tablet (137 mcg total) by mouth daily. 90 tablet 3   No current facility-administered medications on file prior to visit.    ROS Review of Systems  Constitutional:  Negative for fever.  Respiratory:  Negative for shortness of breath.   Cardiovascular:  Negative for chest pain.  Musculoskeletal:  Negative for arthralgias.  Skin:  Negative for rash.   Objective:  BP 125/82   Pulse 89   Temp (!) 97.3 F (36.3 C)   Ht 6' 1"  (1.854 m)   Wt (!) 303 lb 3.2 oz (137.5 kg)   SpO2 95%   BMI 40.00 kg/m   BP Readings from Last 3 Encounters:  08/18/21 125/82  03/05/21 126/87  09/04/20 127/83    Wt Readings from Last 3 Encounters:  08/18/21 (!) 303 lb 3.2 oz (137.5 kg)  03/05/21 (!) 324 lb (147 kg)  09/04/20 (!) 326 lb 6.4 oz (148.1 kg)     Physical Exam Vitals reviewed.  Constitutional:      General: He is not in acute distress.    Appearance: He is well-developed.  HENT:     Head: Normocephalic and atraumatic.     Right Ear: External ear normal.     Left Ear:  External ear normal.     Nose: Nose normal.     Mouth/Throat:     Pharynx: No oropharyngeal exudate or posterior oropharyngeal erythema.  Eyes:     Conjunctiva/sclera: Conjunctivae normal.     Pupils: Pupils are equal, round, and reactive to light.  Cardiovascular:     Rate and Rhythm: Normal rate and regular rhythm.     Heart sounds: Normal heart sounds. No murmur heard. Pulmonary:     Effort: Pulmonary effort is normal. No respiratory distress.     Breath sounds: Normal breath sounds. No wheezing or rales.  Abdominal:     Palpations: Abdomen is soft.     Tenderness: There is no abdominal  tenderness.  Musculoskeletal:        General: Normal range of motion.     Cervical back: Normal range of motion and neck supple.  Skin:    General: Skin is warm and dry.  Neurological:     Mental Status: He is alert and oriented to person, place, and time.     Deep Tendon Reflexes: Reflexes are normal and symmetric.  Psychiatric:        Behavior: Behavior normal.        Thought Content: Thought content normal.        Judgment: Judgment normal.      Assessment & Plan:   Joe Romero was seen today for medical management of chronic issues.  Diagnoses and all orders for this visit:  Acquired hypothyroidism -     TSH + free T4  Essential hypertension -     CBC with Differential/Platelet -     CMP14+EGFR  Mixed hyperlipidemia -     Lipid panel  Localized edema -     chlorthalidone (HYGROTON) 25 MG tablet; Take 1 tablet (25 mg total) by mouth daily.  Other orders -     phentermine 37.5 MG capsule; Take 1 capsule (37.5 mg total) by mouth every morning. For weight loss -     clotrimazole-betamethasone (LOTRISONE) cream; Apply 1 application topically 2 (two) times daily. To affected areas until rash clears  Allergies as of 08/18/2021   No Known Allergies      Medication List        Accurate as of August 18, 2021 10:02 PM. If you have any questions, ask your nurse or doctor.          STOP taking these medications    topiramate 25 MG tablet Commonly known as: TOPAMAX Stopped by: Claretta Fraise, MD       TAKE these medications    atorvastatin 20 MG tablet Commonly known as: LIPITOR Take 1 tablet (20 mg total) by mouth daily.   chlorthalidone 25 MG tablet Commonly known as: HYGROTON Take 1 tablet (25 mg total) by mouth daily. What changed: additional instructions Changed by: Claretta Fraise, MD   clotrimazole-betamethasone cream Commonly known as: Lotrisone Apply 1 application topically 2 (two) times daily. To affected areas until rash clears Started by:  Claretta Fraise, MD   diclofenac 75 MG EC tablet Commonly known as: VOLTAREN Take 1 tablet (75 mg total) by mouth 2 (two) times daily.   EQ Aspirin Adult Low Dose 81 MG EC tablet Generic drug: aspirin Take 1 tablet by mouth once daily   gabapentin 300 MG capsule Commonly known as: NEURONTIN Take 1 capsule (300 mg total) by mouth 2 (two) times daily.   levothyroxine 137 MCG tablet Commonly known as: SYNTHROID Take 1 tablet (137 mcg total) by mouth  daily.   phentermine 37.5 MG capsule Take 1 capsule (37.5 mg total) by mouth every morning. For weight loss        Meds ordered this encounter  Medications   chlorthalidone (HYGROTON) 25 MG tablet    Sig: Take 1 tablet (25 mg total) by mouth daily.    Dispense:  90 tablet    Refill:  3   phentermine 37.5 MG capsule    Sig: Take 1 capsule (37.5 mg total) by mouth every morning. For weight loss    Dispense:  30 capsule    Refill:  2   clotrimazole-betamethasone (LOTRISONE) cream    Sig: Apply 1 application topically 2 (two) times daily. To affected areas until rash clears    Dispense:  45 g    Refill:  1      Follow-up: No follow-ups on file.  Claretta Fraise, M.D.

## 2021-08-31 ENCOUNTER — Other Ambulatory Visit: Payer: Self-pay | Admitting: Family Medicine

## 2021-08-31 DIAGNOSIS — G629 Polyneuropathy, unspecified: Secondary | ICD-10-CM

## 2022-02-16 ENCOUNTER — Ambulatory Visit: Payer: BC Managed Care – PPO | Admitting: Family Medicine

## 2022-02-16 ENCOUNTER — Encounter: Payer: Self-pay | Admitting: Family Medicine

## 2022-02-16 VITALS — BP 113/73 | HR 82 | Temp 97.0°F | Ht 72.0 in | Wt 310.8 lb

## 2022-02-16 DIAGNOSIS — E039 Hypothyroidism, unspecified: Secondary | ICD-10-CM

## 2022-02-16 DIAGNOSIS — G629 Polyneuropathy, unspecified: Secondary | ICD-10-CM | POA: Diagnosis not present

## 2022-02-16 DIAGNOSIS — I10 Essential (primary) hypertension: Secondary | ICD-10-CM

## 2022-02-16 DIAGNOSIS — E782 Mixed hyperlipidemia: Secondary | ICD-10-CM

## 2022-02-16 DIAGNOSIS — R600 Localized edema: Secondary | ICD-10-CM

## 2022-02-16 MED ORDER — CHLORTHALIDONE 25 MG PO TABS: 25.0000 mg | ORAL_TABLET | Freq: Every day | ORAL | 3 refills | Status: DC

## 2022-02-16 MED ORDER — DICLOFENAC SODIUM 75 MG PO TBEC: 75.0000 mg | DELAYED_RELEASE_TABLET | Freq: Two times a day (BID) | ORAL | 3 refills | Status: DC

## 2022-02-16 MED ORDER — GABAPENTIN 300 MG PO CAPS: 300.0000 mg | ORAL_CAPSULE | Freq: Two times a day (BID) | ORAL | 3 refills | Status: DC

## 2022-02-16 MED ORDER — LEVOTHYROXINE SODIUM 137 MCG PO TABS: 137.0000 ug | ORAL_TABLET | Freq: Every day | ORAL | 3 refills | Status: DC

## 2022-02-16 MED ORDER — ATORVASTATIN CALCIUM 20 MG PO TABS: 20.0000 mg | ORAL_TABLET | Freq: Every day | ORAL | 3 refills | Status: DC

## 2022-02-16 NOTE — Progress Notes (Signed)
Subjective:  Patient ID: Joe Romero, male    DOB: [birth year]  Age: 56 y.o. MRN: [id number]  CC: Medical Management of Chronic Issues (6 month Followup )   HPI Joe Romero presents for  follow-up on  thyroid. The patient has a history of hypothyroidism for many years. It has been stable recently. Pt. denies any change in  voice, loss of hair, heat or cold intolerance. Energy level has been adequate to good. Patient denies constipation and diarrhea. No myxedema. Medication is as noted below. Verified that pt is taking it daily on an empty stomach. Well tolerated.  Phentermine caused palpitations. DCed it.    presents for  follow-up of hypertension. Patient has no history of headache chest pain or shortness of breath or recent cough. Patient also denies symptoms of TIA such as focal numbness or weakness. Patient denies side effects from medication. States taking it regularly.      02/16/2022    4:21 PM 08/18/2021    3:20 PM 03/05/2021    9:19 AM  Depression screen PHQ 2/9  Decreased Interest 0 0 0  Down, Depressed, Hopeless 0 0 0  PHQ - 2 Score 0 0 0  Altered sleeping   0  Tired, decreased energy   3  Change in appetite   3  Feeling bad or failure about yourself    0  Trouble concentrating   0  Moving slowly or fidgety/restless   0  Suicidal thoughts   0  PHQ-9 Score   6  Difficult doing work/chores   Not difficult at all    History Joe Romero has a past medical history of Anxiety, Back pain, Dyspnea, GERD (gastroesophageal reflux disease), Hyperlipidemia, Hypertension, Sleep apnea, and Thyroid disease.   He has a past surgical history that includes Finger surgery (Right).   His family history includes Anxiety disorder in his mother; Depression in his mother; Diabetes in his father; Hypertension in his father.He reports that he quit smoking about 15 years ago. His smoking use included cigarettes. He has a 20.00 pack-year smoking history. He has never used smokeless tobacco. He reports  that he does not drink alcohol and does not use drugs.    ROS Review of Systems  Constitutional:  Negative for fever.  Respiratory:  Negative for shortness of breath.   Cardiovascular:  Positive for leg swelling (intermittent, especially when at the beach). Negative for chest pain.  Musculoskeletal:  Negative for arthralgias.  Skin:  Negative for rash.    Objective:  BP 113/73   Pulse 82   Temp (!) 97 F (36.1 C)   Ht 6' (1.829 m)   Wt (!) 310 lb 12.8 oz (141 kg)   SpO2 95%   BMI 42.15 kg/m   BP Readings from Last 3 Encounters:  02/16/22 113/73  08/18/21 125/82  03/05/21 126/87    Wt Readings from Last 3 Encounters:  02/16/22 (!) 310 lb 12.8 oz (141 kg)  08/18/21 (!) 303 lb 3.2 oz (137.5 kg)  03/05/21 (!) 324 lb (147 kg)     Physical Exam Vitals reviewed.  Constitutional:      Appearance: He is well-developed.  HENT:     Head: Normocephalic and atraumatic.     Right Ear: External ear normal.     Left Ear: External ear normal.     Mouth/Throat:     Pharynx: No oropharyngeal exudate or posterior oropharyngeal erythema.  Eyes:     Pupils: Pupils are equal, round, and reactive to light.  Cardiovascular:     Rate and Rhythm: Normal rate and regular rhythm.     Heart sounds: No murmur heard. Pulmonary:     Effort: No respiratory distress.     Breath sounds: Normal breath sounds.  Musculoskeletal:     Cervical back: Normal range of motion and neck supple.  Neurological:     Mental Status: He is alert and oriented to person, place, and time.       Assessment & Plan:   Joe Romero was seen today for medical management of chronic issues.  Diagnoses and all orders for this visit:  Essential hypertension -     TSH + free T4 -     CBC with Differential/Platelet -     CMP14+EGFR -     Lipid panel  Mixed hyperlipidemia -     TSH + free T4 -     CBC with Differential/Platelet -     CMP14+EGFR -     Lipid panel -     atorvastatin (LIPITOR) 20 MG tablet; Take  1 tablet (20 mg total) by mouth daily.  Acquired hypothyroidism -     TSH + free T4 -     CBC with Differential/Platelet -     CMP14+EGFR -     Lipid panel -     levothyroxine (SYNTHROID) 137 MCG tablet; Take 1 tablet (137 mcg total) by mouth daily.  Neuropathy -     gabapentin (NEURONTIN) 300 MG capsule; Take 1 capsule (300 mg total) by mouth 2 (two) times daily.  Localized edema -     chlorthalidone (HYGROTON) 25 MG tablet; Take 1 tablet (25 mg total) by mouth daily.  Other orders -     diclofenac (VOLTAREN) 75 MG EC tablet; Take 1 tablet (75 mg total) by mouth 2 (two) times daily.       I have discontinued Joe Romero's EQ Aspirin Adult Low Dose and phentermine. I have also changed his gabapentin. Additionally, I am having him maintain his clotrimazole-betamethasone, diclofenac, levothyroxine, chlorthalidone, and atorvastatin.  Allergies as of 02/16/2022   No Known Allergies      Medication List        Accurate as of February 16, 2022  4:47 PM. If you have any questions, ask your nurse or doctor.          STOP taking these medications    EQ Aspirin Adult Low Dose 81 MG tablet Generic drug: aspirin EC Stopped by: Claretta Fraise, MD   phentermine 37.5 MG capsule Stopped by: Claretta Fraise, MD       TAKE these medications    atorvastatin 20 MG tablet Commonly known as: LIPITOR Take 1 tablet (20 mg total) by mouth daily.   chlorthalidone 25 MG tablet Commonly known as: HYGROTON Take 1 tablet (25 mg total) by mouth daily.   clotrimazole-betamethasone cream Commonly known as: Lotrisone Apply 1 application topically 2 (two) times daily. To affected areas until rash clears   diclofenac 75 MG EC tablet Commonly known as: VOLTAREN Take 1 tablet (75 mg total) by mouth 2 (two) times daily.   gabapentin 300 MG capsule Commonly known as: NEURONTIN Take 1 capsule (300 mg total) by mouth 2 (two) times daily.   levothyroxine 137 MCG tablet Commonly known as:  SYNTHROID Take 1 tablet (137 mcg total) by mouth daily.         Follow-up: Return in about 6 months (around 08/18/2022).  Claretta Fraise, M.D.

## 2022-04-15 ENCOUNTER — Encounter (INDEPENDENT_AMBULATORY_CARE_PROVIDER_SITE_OTHER): Payer: Self-pay

## 2022-07-06 DIAGNOSIS — H17823 Peripheral opacity of cornea, bilateral: Secondary | ICD-10-CM | POA: Diagnosis not present

## 2022-07-06 DIAGNOSIS — H25813 Combined forms of age-related cataract, bilateral: Secondary | ICD-10-CM | POA: Diagnosis not present

## 2022-07-14 DIAGNOSIS — E039 Hypothyroidism, unspecified: Secondary | ICD-10-CM | POA: Diagnosis not present

## 2022-07-14 DIAGNOSIS — Z131 Encounter for screening for diabetes mellitus: Secondary | ICD-10-CM | POA: Diagnosis not present

## 2022-07-14 DIAGNOSIS — M2550 Pain in unspecified joint: Secondary | ICD-10-CM | POA: Diagnosis not present

## 2022-07-14 DIAGNOSIS — E291 Testicular hypofunction: Secondary | ICD-10-CM | POA: Diagnosis not present

## 2022-07-14 DIAGNOSIS — R5383 Other fatigue: Secondary | ICD-10-CM | POA: Diagnosis not present

## 2022-07-14 DIAGNOSIS — E559 Vitamin D deficiency, unspecified: Secondary | ICD-10-CM | POA: Diagnosis not present

## 2022-07-14 DIAGNOSIS — E785 Hyperlipidemia, unspecified: Secondary | ICD-10-CM | POA: Diagnosis not present

## 2022-07-14 DIAGNOSIS — E611 Iron deficiency: Secondary | ICD-10-CM | POA: Diagnosis not present

## 2022-07-14 DIAGNOSIS — I10 Essential (primary) hypertension: Secondary | ICD-10-CM | POA: Diagnosis not present

## 2022-07-14 DIAGNOSIS — E538 Deficiency of other specified B group vitamins: Secondary | ICD-10-CM | POA: Diagnosis not present

## 2022-08-17 ENCOUNTER — Encounter: Payer: Self-pay | Admitting: Family Medicine

## 2022-08-17 ENCOUNTER — Ambulatory Visit: Payer: BC Managed Care – PPO | Admitting: Family Medicine

## 2022-08-17 VITALS — BP 118/79 | HR 82 | Temp 96.7°F | Ht 72.0 in | Wt 335.8 lb

## 2022-08-17 DIAGNOSIS — I10 Essential (primary) hypertension: Secondary | ICD-10-CM | POA: Diagnosis not present

## 2022-08-17 DIAGNOSIS — G629 Polyneuropathy, unspecified: Secondary | ICD-10-CM

## 2022-08-17 DIAGNOSIS — E039 Hypothyroidism, unspecified: Secondary | ICD-10-CM | POA: Diagnosis not present

## 2022-08-17 DIAGNOSIS — E782 Mixed hyperlipidemia: Secondary | ICD-10-CM | POA: Diagnosis not present

## 2022-08-17 MED ORDER — GABAPENTIN 600 MG PO TABS: 600.0000 mg | ORAL_TABLET | Freq: Two times a day (BID) | ORAL | 3 refills | Status: DC

## 2022-08-17 MED ORDER — LEVOTHYROXINE SODIUM 150 MCG PO TABS: 150.0000 ug | ORAL_TABLET | Freq: Every day | ORAL | 3 refills | Status: DC

## 2022-08-17 NOTE — Progress Notes (Signed)
Subjective:  Patient ID: Joe Romero, male    DOB: 1966-03-30  Age: 56 y.o. MRN: [id number]  CC: Medical Management of Chronic Issues   HPI Joe Romero presents for  follow-up on  thyroid. The patient has a history of hypothyroidism for many years. It has been stable recently. Pt. denies any change in  voice, loss of hair, heat or cold intolerance. Energy level has been adequate to good. Patient denies constipation and diarrhea. No myxedema. Medication is as noted below. Verified that pt is taking it daily on an empty stomach. Well tolerated.   Had low testosterone, Vit. D, Vit. B12 on recent blood tests with his daughter's doctor, [place]. Now taking testosterone injections..      08/17/2022    8:02 AM 02/16/2022    4:21 PM 08/18/2021    3:20 PM  Depression screen PHQ 2/9  Decreased Interest 0 0 0  Down, Depressed, Hopeless 0 0 0  PHQ - 2 Score 0 0 0    History Joe Romero has a past medical history of Anxiety, Back pain, Dyspnea, GERD (gastroesophageal reflux disease), Hyperlipidemia, Hypertension, Sleep apnea, and Thyroid disease.   He has a past surgical history that includes Finger surgery (Right).   His family history includes Anxiety disorder in his mother; Depression in his mother; Diabetes in his father; Hypertension in his father.He reports that he quit smoking about 15 years ago. His smoking use included cigarettes. He has a 20.00 pack-year smoking history. He has never used smokeless tobacco. He reports that he does not drink alcohol and does not use drugs.    ROS Review of Systems  Constitutional:  Negative for fever.  Respiratory:  Negative for shortness of breath.   Cardiovascular:  Negative for chest pain.  Musculoskeletal:  Negative for arthralgias.  Skin:  Negative for rash.    Objective:  BP 118/79   Pulse 82   Temp (!) 96.7 F (35.9 C)   Ht 6' (1.829 m)   Wt (!) 335 lb 12.8 oz (152.3 kg)   SpO2 96%   BMI 45.54 kg/m   BP Readings  from Last 3 Encounters:  08/17/22 118/79  02/16/22 113/73  08/18/21 125/82    Wt Readings from Last 3 Encounters:  08/17/22 (!) 335 lb 12.8 oz (152.3 kg)  02/16/22 (!) 310 lb 12.8 oz (141 kg)  08/18/21 (!) 303 lb 3.2 oz (137.5 kg)     Physical Exam Vitals reviewed.  Constitutional:      Appearance: He is well-developed.  HENT:     Head: Normocephalic and atraumatic.     Right Ear: External ear normal.     Left Ear: External ear normal.     Mouth/Throat:     Pharynx: No oropharyngeal exudate or posterior oropharyngeal erythema.  Eyes:     Pupils: Pupils are equal, round, and reactive to light.  Cardiovascular:     Rate and Rhythm: Normal rate and regular rhythm.     Heart sounds: No murmur heard. Pulmonary:     Effort: No respiratory distress.     Breath sounds: Normal breath sounds.  Musculoskeletal:     Cervical back: Normal range of motion and neck supple.  Neurological:     Mental Status: He is alert and oriented to person, place, and time.       Assessment & Plan:   Joe Romero was seen today for medical management of chronic issues.  Diagnoses and all orders for this visit:  Essential hypertension -  CBC with Differential/Platelet -     Lipid panel -     CMP14+EGFR  Mixed hyperlipidemia -     Lipid panel -     CMP14+EGFR  Acquired hypothyroidism -     Lipid panel -     CMP14+EGFR -     levothyroxine (SYNTHROID) 150 MCG tablet; Take 1 tablet (150 mcg total) by mouth daily.  Neuropathy  Other orders -     gabapentin (NEURONTIN) 600 MG tablet; Take 1 tablet (600 mg total) by mouth 2 (two) times daily.       I have discontinued Diogenes Daidone's gabapentin. I have also changed his levothyroxine. Additionally, I am having him start on gabapentin. Lastly, I am having him maintain his clotrimazole-betamethasone, diclofenac, chlorthalidone, atorvastatin, and testosterone cypionate.  Allergies as of 08/17/2022   No Known Allergies      Medication List         Accurate as of August 17, 2022 10:33 PM. If you have any questions, ask your nurse or doctor.          STOP taking these medications    gabapentin 300 MG capsule Commonly known as: NEURONTIN Replaced by: gabapentin 600 MG tablet Stopped by: Claretta Fraise, MD       TAKE these medications    atorvastatin 20 MG tablet Commonly known as: LIPITOR Take 1 tablet (20 mg total) by mouth daily.   chlorthalidone 25 MG tablet Commonly known as: HYGROTON Take 1 tablet (25 mg total) by mouth daily.   clotrimazole-betamethasone cream Commonly known as: Lotrisone Apply 1 application topically 2 (two) times daily. To affected areas until rash clears   diclofenac 75 MG EC tablet Commonly known as: VOLTAREN Take 1 tablet (75 mg total) by mouth 2 (two) times daily.   gabapentin 600 MG tablet Commonly known as: Neurontin Take 1 tablet (600 mg total) by mouth 2 (two) times daily. Replaces: gabapentin 300 MG capsule Started by: Claretta Fraise, MD   levothyroxine 150 MCG tablet Commonly known as: SYNTHROID Take 1 tablet (150 mcg total) by mouth daily. What changed:  medication strength how much to take Changed by: Claretta Fraise, MD   testosterone cypionate 200 MG/ML injection Commonly known as: DEPOTESTOSTERONE CYPIONATE SMARTSIG:0.8 Milliliter(s) IM Once a Week         Follow-up: Return in about 6 months (around 02/16/2023).  Claretta Fraise, M.D.

## 2022-08-18 LAB — CBC WITH DIFFERENTIAL/PLATELET
Basophils Absolute: 0.1 10*3/uL (ref 0.0–0.2)
Basos: 1 %
EOS (ABSOLUTE): 0.3 10*3/uL (ref 0.0–0.4)
Eos: 2 %
Hematocrit: 45.3 % (ref 37.5–51.0)
Hemoglobin: 15.6 g/dL (ref 13.0–17.7)
Immature Grans (Abs): 0.1 10*3/uL (ref 0.0–0.1)
Immature Granulocytes: 1 %
Lymphocytes Absolute: 2.6 10*3/uL (ref 0.7–3.1)
Lymphs: 24 %
MCH: 30.7 pg (ref 26.6–33.0)
MCHC: 34.4 g/dL (ref 31.5–35.7)
MCV: 89 fL (ref 79–97)
Monocytes Absolute: 0.6 10*3/uL (ref 0.1–0.9)
Monocytes: 6 %
Neutrophils Absolute: 7.2 10*3/uL — ABNORMAL HIGH (ref 1.4–7.0)
Neutrophils: 66 %
Platelets: 278 10*3/uL (ref 150–450)
RBC: 5.08 x10E6/uL (ref 4.14–5.80)
RDW: 13.1 % (ref 11.6–15.4)
WBC: 10.8 10*3/uL (ref 3.4–10.8)

## 2022-08-18 LAB — CMP14+EGFR
ALT: 102 IU/L — ABNORMAL HIGH (ref 0–44)
AST: 54 IU/L — ABNORMAL HIGH (ref 0–40)
Albumin/Globulin Ratio: 1.7 (ref 1.2–2.2)
Albumin: 4.3 g/dL (ref 3.8–4.9)
Alkaline Phosphatase: 67 IU/L (ref 44–121)
BUN/Creatinine Ratio: 12 (ref 9–20)
BUN: 14 mg/dL (ref 6–24)
Bilirubin Total: 0.4 mg/dL (ref 0.0–1.2)
CO2: 27 mmol/L (ref 20–29)
Calcium: 9.3 mg/dL (ref 8.7–10.2)
Chloride: 99 mmol/L (ref 96–106)
Creatinine, Ser: 1.15 mg/dL (ref 0.76–1.27)
Globulin, Total: 2.6 g/dL (ref 1.5–4.5)
Glucose: 106 mg/dL — ABNORMAL HIGH (ref 70–99)
Potassium: 4 mmol/L (ref 3.5–5.2)
Sodium: 142 mmol/L (ref 134–144)
Total Protein: 6.9 g/dL (ref 6.0–8.5)
eGFR: 75 mL/min/{1.73_m2} (ref >59)

## 2022-08-18 LAB — LIPID PANEL
Chol/HDL Ratio: 5.7 ratio — ABNORMAL HIGH (ref 0.0–5.0)
Cholesterol, Total: 187 mg/dL (ref 100–199)
HDL: 33 mg/dL — ABNORMAL LOW (ref >39)
LDL Chol Calc (NIH): 119 mg/dL — ABNORMAL HIGH (ref 0–99)
Triglycerides: 200 mg/dL — ABNORMAL HIGH (ref 0–149)
VLDL Cholesterol Cal: 35 mg/dL (ref 5–40)

## 2022-10-19 ENCOUNTER — Ambulatory Visit: Payer: BC Managed Care – PPO | Admitting: Family Medicine

## 2022-11-11 ENCOUNTER — Encounter: Payer: Self-pay | Admitting: Family Medicine

## 2022-11-11 ENCOUNTER — Ambulatory Visit (INDEPENDENT_AMBULATORY_CARE_PROVIDER_SITE_OTHER): Payer: No Typology Code available for payment source | Admitting: Family Medicine

## 2022-11-11 VITALS — BP 133/84 | HR 69 | Temp 97.7°F | Ht 72.0 in | Wt 327.6 lb

## 2022-11-11 DIAGNOSIS — E782 Mixed hyperlipidemia: Secondary | ICD-10-CM

## 2022-11-11 DIAGNOSIS — E039 Hypothyroidism, unspecified: Secondary | ICD-10-CM | POA: Diagnosis not present

## 2022-11-11 DIAGNOSIS — I10 Essential (primary) hypertension: Secondary | ICD-10-CM | POA: Diagnosis not present

## 2022-11-11 DIAGNOSIS — Z6841 Body Mass Index (BMI) 40.0 and over, adult: Secondary | ICD-10-CM

## 2022-11-11 MED ORDER — TESTOSTERONE CYPIONATE 200 MG/ML IM SOLN: INTRAMUSCULAR | 1 refills | Status: DC

## 2022-11-11 MED ORDER — TRULICITY 0.75 MG/0.5ML ~~LOC~~ SOAJ: 0.7500 mg | SUBCUTANEOUS | 2 refills | Status: DC

## 2022-11-11 NOTE — Progress Notes (Signed)
Subjective:  Patient ID: Joe Romero, male    DOB: Jul 17, 1966  Age: 57 y.o. MRN: [id number]  CC: Medical Management of Chronic Issues   HPI Joe Romero presents for  follow-up on  thyroid. The patient has a history of hypothyroidism for many years. It has been stable recently. Pt. denies any change in  voice, loss of hair, heat or cold intolerance. Energy level has been adequate to good. Patient denies constipation and diarrhea. No myxedema. Medication is as noted below. Verified that pt is taking it daily on an empty stomach. Well tolerated.   presents for  follow-up of hypertension. Patient has no history of headache chest pain or shortness of breath or recent cough. Patient also denies symptoms of TIA such as focal numbness or weakness. Patient denies side effects from medication. States taking it regularly.   in for follow-up of elevated cholesterol. Doing well without complaints on current medication. Denies side effects of statin including myalgia and arthralgia and nausea. Currently no chest pain, shortness of breath or other cardiovascular related symptoms noted.  Pt. Would like trulicity for weight loss. Found a coupon for $25.     11/11/2022   10:21 AM 08/17/2022    8:02 AM 02/16/2022    4:21 PM  Depression screen PHQ 2/9  Decreased Interest 0 0 0  Down, Depressed, Hopeless 0 0 0  PHQ - 2 Score 0 0 0    History Davinder has a past medical history of Anxiety, Back pain, Dyspnea, GERD (gastroesophageal reflux disease), Hyperlipidemia, Hypertension, Sleep apnea, and Thyroid disease.   He has a past surgical history that includes Finger surgery (Right).   His family history includes Anxiety disorder in his mother; Depression in his mother; Diabetes in his father; Hypertension in his father.He reports that he quit smoking about 15 years ago. His smoking use included cigarettes. He has a 20.00 pack-year smoking history. He has never used smokeless tobacco. He reports that he does not  drink alcohol and does not use drugs.    ROS Review of Systems  Constitutional:  Negative for fever.  Respiratory:  Negative for shortness of breath.   Cardiovascular:  Negative for chest pain.  Musculoskeletal:  Negative for arthralgias.  Skin:  Negative for rash.    Objective:  BP 133/84   Pulse 69   Temp 97.7 F (36.5 C)   Ht 6' (1.829 m)   Wt (!) 327 lb 9.6 oz (148.6 kg)   SpO2 95%   BMI 44.43 kg/m   BP Readings from Last 3 Encounters:  11/11/22 133/84  08/17/22 118/79  02/16/22 113/73    Wt Readings from Last 3 Encounters:  11/11/22 (!) 327 lb 9.6 oz (148.6 kg)  08/17/22 (!) 335 lb 12.8 oz (152.3 kg)  02/16/22 (!) 310 lb 12.8 oz (141 kg)     Physical Exam Vitals reviewed.  Constitutional:      Appearance: He is well-developed.  HENT:     Head: Normocephalic and atraumatic.     Right Ear: External ear normal.     Left Ear: External ear normal.     Mouth/Throat:     Pharynx: No oropharyngeal exudate or posterior oropharyngeal erythema.  Eyes:     Pupils: Pupils are equal, round, and reactive to light.  Cardiovascular:     Rate and Rhythm: Normal rate and regular rhythm.     Heart sounds: No murmur heard. Pulmonary:     Effort: No respiratory distress.     Breath sounds:  Normal breath sounds.  Musculoskeletal:     Cervical back: Normal range of motion and neck supple.  Neurological:     Mental Status: He is alert and oriented to person, place, and time.       Assessment & Plan:   [name] was seen today for medical management of chronic issues.  Diagnoses and all orders for this visit:  Essential hypertension -     CBC with Differential/Platelet -     CMP14+EGFR  Mixed hyperlipidemia -     Lipid panel  Acquired hypothyroidism -     TSH + free T4  Morbid obesity with BMI of 40.0-44.9, adult (HCC)  Other orders -     Dulaglutide (TRULICITY) A999333 0000000 SOPN; Inject 0.75 mg into the skin once a week. -     testosterone cypionate  (DEPOTESTOSTERONE CYPIONATE) 200 MG/ML injection; SMARTSIG:0.8 Milliliter(s) IM Once a Week       I am having Breslin Cortright start on Trulicity. I am also having him maintain his clotrimazole-betamethasone, diclofenac, chlorthalidone, atorvastatin, levothyroxine, gabapentin, and testosterone cypionate.  Allergies as of 11/11/2022   No Known Allergies      Medication List        Accurate as of November 11, 2022  9:37 PM. If you have any questions, ask your nurse or doctor.          atorvastatin 20 MG tablet Commonly known as: LIPITOR Take 1 tablet (20 mg total) by mouth daily.   chlorthalidone 25 MG tablet Commonly known as: HYGROTON Take 1 tablet (25 mg total) by mouth daily.   clotrimazole-betamethasone cream Commonly known as: Lotrisone Apply 1 application topically 2 (two) times daily. To affected areas until rash clears   diclofenac 75 MG EC tablet Commonly known as: VOLTAREN Take 1 tablet (75 mg total) by mouth 2 (two) times daily.   gabapentin 600 MG tablet Commonly known as: Neurontin Take 1 tablet (600 mg total) by mouth 2 (two) times daily.   levothyroxine 150 MCG tablet Commonly known as: SYNTHROID Take 1 tablet (150 mcg total) by mouth daily.   testosterone cypionate 200 MG/ML injection Commonly known as: DEPOTESTOSTERONE CYPIONATE SMARTSIG:0.8 Milliliter(s) IM Once a Week   Trulicity A999333 0000000 Sopn Generic drug: Dulaglutide Inject 0.75 mg into the skin once a week. Started by: Claretta Fraise, MD       45 minutes spent with pt. More than 1/2 in counseling regarding his weight, its relation to his chronic edema and his pain control.  Follow-up: Return in about 3 months (around 02/11/2023).  Claretta Fraise, M.D.

## 2022-11-12 ENCOUNTER — Other Ambulatory Visit: Payer: Self-pay | Admitting: Family Medicine

## 2022-11-12 ENCOUNTER — Other Ambulatory Visit: Payer: Self-pay | Admitting: *Deleted

## 2022-11-12 DIAGNOSIS — E039 Hypothyroidism, unspecified: Secondary | ICD-10-CM

## 2022-11-12 LAB — CBC WITH DIFFERENTIAL/PLATELET
Basophils Absolute: 0.1 10*3/uL (ref 0.0–0.2)
Basos: 1 %
EOS (ABSOLUTE): 0.2 10*3/uL (ref 0.0–0.4)
Eos: 2 %
Hematocrit: 52 % — ABNORMAL HIGH (ref 37.5–51.0)
Hemoglobin: 17.5 g/dL (ref 13.0–17.7)
Immature Grans (Abs): 0.1 10*3/uL (ref 0.0–0.1)
Immature Granulocytes: 1 %
Lymphocytes Absolute: 2.2 10*3/uL (ref 0.7–3.1)
Lymphs: 19 %
MCH: 30.1 pg (ref 26.6–33.0)
MCHC: 33.7 g/dL (ref 31.5–35.7)
MCV: 89 fL (ref 79–97)
Monocytes Absolute: 0.9 10*3/uL (ref 0.1–0.9)
Monocytes: 7 %
Neutrophils Absolute: 8.4 10*3/uL — ABNORMAL HIGH (ref 1.4–7.0)
Neutrophils: 70 %
Platelets: 271 10*3/uL (ref 150–450)
RBC: 5.82 x10E6/uL — ABNORMAL HIGH (ref 4.14–5.80)
RDW: 12.9 % (ref 11.6–15.4)
WBC: 11.8 10*3/uL — ABNORMAL HIGH (ref 3.4–10.8)

## 2022-11-12 LAB — TSH+FREE T4
Free T4: 1.18 ng/dL (ref 0.82–1.77)
TSH: 6.48 u[IU]/mL — ABNORMAL HIGH (ref 0.450–4.500)

## 2022-11-12 LAB — CMP14+EGFR
ALT: 86 IU/L — ABNORMAL HIGH (ref 0–44)
AST: 56 IU/L — ABNORMAL HIGH (ref 0–40)
Albumin/Globulin Ratio: 1.8 (ref 1.2–2.2)
Albumin: 4.4 g/dL (ref 3.8–4.9)
Alkaline Phosphatase: 65 IU/L (ref 44–121)
BUN/Creatinine Ratio: 16 (ref 9–20)
BUN: 20 mg/dL (ref 6–24)
Bilirubin Total: 0.7 mg/dL (ref 0.0–1.2)
CO2: 26 mmol/L (ref 20–29)
Calcium: 9.5 mg/dL (ref 8.7–10.2)
Chloride: 97 mmol/L (ref 96–106)
Creatinine, Ser: 1.25 mg/dL (ref 0.76–1.27)
Globulin, Total: 2.4 g/dL (ref 1.5–4.5)
Glucose: 93 mg/dL (ref 70–99)
Potassium: 4 mmol/L (ref 3.5–5.2)
Sodium: 139 mmol/L (ref 134–144)
Total Protein: 6.8 g/dL (ref 6.0–8.5)
eGFR: 68 mL/min/{1.73_m2} (ref >59)

## 2022-11-12 LAB — LIPID PANEL
Chol/HDL Ratio: 5.4 ratio — ABNORMAL HIGH (ref 0.0–5.0)
Cholesterol, Total: 184 mg/dL (ref 100–199)
HDL: 34 mg/dL — ABNORMAL LOW (ref >39)
LDL Chol Calc (NIH): 111 mg/dL — ABNORMAL HIGH (ref 0–99)
Triglycerides: 221 mg/dL — ABNORMAL HIGH (ref 0–149)
VLDL Cholesterol Cal: 39 mg/dL (ref 5–40)

## 2022-11-12 MED ORDER — LEVOTHYROXINE SODIUM 175 MCG PO TABS: 175.0000 ug | ORAL_TABLET | Freq: Every day | ORAL | 3 refills | Status: DC

## 2022-11-17 ENCOUNTER — Telehealth: Payer: Self-pay | Admitting: Family Medicine

## 2022-11-17 NOTE — Telephone Encounter (Signed)
Patient called stating that Dr Livia Snellen told him if his insurance didn't pay for his Ozempic Rx that Dr Livia Snellen could send it to some pharmacy in [place] that would only cost him $200 a month.  Pt wants Dr Livia Snellen to send the Rx there and let him know the address of where to pick Rx up at.

## 2022-11-18 NOTE — Telephone Encounter (Signed)
Scrip written please fax & let pt. Know it has been done

## 2022-11-19 NOTE — Telephone Encounter (Signed)
PRESCRIPTION FAXED AS REQUESTED

## 2022-12-14 ENCOUNTER — Encounter: Payer: Self-pay | Admitting: Family Medicine

## 2022-12-14 ENCOUNTER — Ambulatory Visit (INDEPENDENT_AMBULATORY_CARE_PROVIDER_SITE_OTHER): Payer: No Typology Code available for payment source | Admitting: Family Medicine

## 2022-12-14 VITALS — BP 132/76 | HR 87 | Temp 98.3°F | Ht 72.0 in | Wt 335.0 lb

## 2022-12-14 DIAGNOSIS — E782 Mixed hyperlipidemia: Secondary | ICD-10-CM | POA: Diagnosis not present

## 2022-12-14 DIAGNOSIS — R600 Localized edema: Secondary | ICD-10-CM | POA: Diagnosis not present

## 2022-12-14 DIAGNOSIS — E039 Hypothyroidism, unspecified: Secondary | ICD-10-CM | POA: Diagnosis not present

## 2022-12-14 MED ORDER — ATORVASTATIN CALCIUM 20 MG PO TABS: 20.0000 mg | ORAL_TABLET | Freq: Every day | ORAL | 3 refills | Status: DC

## 2022-12-14 MED ORDER — CHLORTHALIDONE 25 MG PO TABS: 25.0000 mg | ORAL_TABLET | Freq: Every day | ORAL | 3 refills | Status: DC

## 2022-12-14 NOTE — Progress Notes (Signed)
Subjective:  Patient ID: Joe Romero, male    DOB: 1966-05-07  Age: 57 y.o. MRN: [id number]  CC: Hypothyroidism   HPI Joe Romero presents for  follow-up on  thyroid. The patient has a history of hypothyroidism for many years. It has been stable recently. Pt. denies any change in  voice, loss of hair, heat or cold intolerance. Energy level has been adequate to good. Patient denies constipation and diarrhea. No myxedema. Medication is as noted below. Verified that pt is taking it daily on an empty stomach. Well tolerated.  Just started compounded semaglutide. Has had 2 shots. Appetite beginning to decrease. Has noted decreased Bms.   Stopped diclofenac & gabapentin. Lost 7 pounds of fluid. However pain was awful so Joe Romero had to resume.      12/14/2022   10:29 AM 11/11/2022   10:21 AM 08/17/2022    8:02 AM  Depression screen PHQ 2/9  Decreased Interest 0 0 0  Down, Depressed, Hopeless 0 0 0  PHQ - 2 Score 0 0 0    History Joe Romero has a past medical history of Anxiety, Back pain, Dyspnea, GERD (gastroesophageal reflux disease), Hyperlipidemia, Hypertension, Sleep apnea, and Thyroid disease.   Joe Romero has a past surgical history that includes Finger surgery (Right).   His family history includes Anxiety disorder in his mother; Depression in his mother; Diabetes in his father; Hypertension in his father.Joe Romero reports that Joe Romero quit smoking about 15 years ago. His smoking use included cigarettes. Joe Romero has a 20.00 pack-year smoking history. Joe Romero has never used smokeless tobacco. Joe Romero reports that Joe Romero does not drink alcohol and does not use drugs.    ROS Review of Systems  Constitutional:  Negative for fever.  Respiratory:  Negative for shortness of breath.   Cardiovascular:  Negative for chest pain.  Musculoskeletal:  Negative for arthralgias.  Skin:  Negative for rash.    Objective:  BP 132/76   Pulse 87   Temp 98.3 F (36.8 C)   Ht 6' (1.829 m)   Wt (!) 335 lb (152 kg)   SpO2 95%   BMI 45.43 kg/m    BP Readings from Last 3 Encounters:  12/14/22 132/76  11/11/22 133/84  08/17/22 118/79    Wt Readings from Last 3 Encounters:  12/14/22 (!) 335 lb (152 kg)  11/11/22 (!) 327 lb 9.6 oz (148.6 kg)  08/17/22 (!) 335 lb 12.8 oz (152.3 kg)     Physical Exam Vitals reviewed.  Constitutional:      Appearance: Joe Romero is well-developed.  HENT:     Head: Normocephalic and atraumatic.     Right Ear: External ear normal.     Left Ear: External ear normal.     Mouth/Throat:     Pharynx: No oropharyngeal exudate or posterior oropharyngeal erythema.  Eyes:     Pupils: Pupils are equal, round, and reactive to light.  Cardiovascular:     Rate and Rhythm: Normal rate and regular rhythm.     Heart sounds: No murmur heard. Pulmonary:     Effort: No respiratory distress.     Breath sounds: Normal breath sounds.  Musculoskeletal:     Cervical back: Normal range of motion and neck supple.  Neurological:     Mental Status: Joe Romero is alert and oriented to person, place, and time.       Assessment & Plan:   Joe Romero was seen today for hypothyroidism.  Diagnoses and all orders for this visit:  Acquired hypothyroidism -  TSH + free T4  Mixed hyperlipidemia -     atorvastatin (LIPITOR) 20 MG tablet; Take 1 tablet (20 mg total) by mouth daily.  Localized edema -     chlorthalidone (HYGROTON) 25 MG tablet; Take 1 tablet (25 mg total) by mouth daily.  Compounded semaglutide 0.5 mg weekly, scrip faxed.     I am having Joe Romero maintain his clotrimazole-betamethasone, diclofenac, gabapentin, Trulicity, testosterone cypionate, levothyroxine, atorvastatin, and chlorthalidone.  Allergies as of 12/14/2022   No Known Allergies      Medication List        Accurate as of December 14, 2022 11:05 AM. If you have any questions, ask your nurse or doctor.          atorvastatin 20 MG tablet Commonly known as: LIPITOR Take 1 tablet (20 mg total) by mouth daily.   chlorthalidone 25 MG  tablet Commonly known as: HYGROTON Take 1 tablet (25 mg total) by mouth daily.   clotrimazole-betamethasone cream Commonly known as: Lotrisone Apply 1 application topically 2 (two) times daily. To affected areas until rash clears   diclofenac 75 MG EC tablet Commonly known as: VOLTAREN Take 1 tablet (75 mg total) by mouth 2 (two) times daily.   gabapentin 600 MG tablet Commonly known as: Neurontin Take 1 tablet (600 mg total) by mouth 2 (two) times daily.   levothyroxine 175 MCG tablet Commonly known as: SYNTHROID Take 1 tablet (175 mcg total) by mouth daily.   testosterone cypionate 200 MG/ML injection Commonly known as: DEPOTESTOSTERONE CYPIONATE SMARTSIG:0.8 Milliliter(s) IM Once a Week   Trulicity 0.75 MG/0.5ML Sopn Generic drug: Dulaglutide Inject 0.75 mg into the skin once a week.         Follow-up: Return in about 3 months (around 03/15/2023).  Mechele Claude, M.D.

## 2022-12-15 LAB — TSH+FREE T4
Free T4: 1.27 ng/dL (ref 0.82–1.77)
TSH: 3.45 u[IU]/mL (ref 0.450–4.500)

## 2022-12-15 NOTE — Progress Notes (Signed)
Hello Temesgen,  Your lab result is normal and/or stable.Some minor variations that are not significant are commonly marked abnormal, but do not represent any medical problem for you.  Best regards, Kaegan Stigler, M.D.

## 2023-02-10 ENCOUNTER — Encounter: Payer: Self-pay | Admitting: Family Medicine

## 2023-02-10 ENCOUNTER — Ambulatory Visit: Payer: No Typology Code available for payment source | Admitting: Family Medicine

## 2023-02-10 VITALS — BP 124/75 | HR 96 | Temp 98.2°F | Ht 72.0 in | Wt 308.0 lb

## 2023-02-10 DIAGNOSIS — S40262A Insect bite (nonvenomous) of left shoulder, initial encounter: Secondary | ICD-10-CM | POA: Diagnosis not present

## 2023-02-10 DIAGNOSIS — T63331A Toxic effect of venom of brown recluse spider, accidental (unintentional), initial encounter: Secondary | ICD-10-CM

## 2023-02-10 MED ORDER — DAPSONE 100 MG PO TABS
100.0000 mg | ORAL_TABLET | Freq: Every day | ORAL | 0 refills | Status: DC
Start: 2023-02-10 — End: 2023-03-25

## 2023-02-10 MED ORDER — AMOXICILLIN-POT CLAVULANATE 875-125 MG PO TABS: 1.0000 | ORAL_TABLET | Freq: Two times a day (BID) | ORAL | 0 refills | Status: DC

## 2023-02-10 NOTE — Progress Notes (Signed)
Chief Complaint  Patient presents with   Insect Bite    LEFT UPPER ARM    HPI  Patient presents today for lesion at the left shoulder that is red for 4-5 das. Occurred in bed. Thinks something bit him. No fever. No resp. Sx. No NVD  PMH: Smoking status noted ROS: Per HPI  Objective: BP 124/75   Pulse 96   Temp 98.2 F (36.8 C)   Ht 6' (1.829 m)   Wt (!) 308 lb (139.7 kg)   SpO2 94%   BMI 41.77 kg/m  Gen: NAD, alert, cooperative with exam HEENT: NCAT, EOMI, PERRL CV: RRR, good S1/S2, no murmur Resp: CTABL, no wheezes, non-labored Skin:6 cm erythema, slightly raised with induration, well demarcated at the left deltoid, proximal and slightly posterior. Mildly tender.  Neuro: Alert and oriented, No gross deficits  Assessment and plan:  1. Brown recluse spider bite or sting, accidental or unintentional, initial encounter     Meds ordered this encounter  Medications   amoxicillin-clavulanate (AUGMENTIN) 875-125 MG tablet    Sig: Take 1 tablet by mouth 2 (two) times daily. Take all of this medication    Dispense:  20 tablet    Refill:  0   dapsone 100 MG tablet    Sig: Take 1 tablet (100 mg total) by mouth daily.    Dispense:  10 tablet    Refill:  0    Follow up as needed.  Mechele Claude, MD

## 2023-03-16 ENCOUNTER — Ambulatory Visit: Payer: No Typology Code available for payment source | Admitting: Family Medicine

## 2023-03-18 ENCOUNTER — Telehealth: Payer: Self-pay | Admitting: Family Medicine

## 2023-03-24 ENCOUNTER — Other Ambulatory Visit: Payer: Self-pay | Admitting: *Deleted

## 2023-03-24 DIAGNOSIS — E782 Mixed hyperlipidemia: Secondary | ICD-10-CM

## 2023-03-24 DIAGNOSIS — R600 Localized edema: Secondary | ICD-10-CM

## 2023-03-24 DIAGNOSIS — E039 Hypothyroidism, unspecified: Secondary | ICD-10-CM

## 2023-03-24 MED ORDER — ATORVASTATIN CALCIUM 20 MG PO TABS
20.0000 mg | ORAL_TABLET | Freq: Every day | ORAL | 2 refills | Status: DC
Start: 2023-03-24 — End: 2023-11-16

## 2023-03-24 MED ORDER — CHLORTHALIDONE 25 MG PO TABS
25.0000 mg | ORAL_TABLET | Freq: Every day | ORAL | 2 refills | Status: DC
Start: 2023-03-24 — End: 2023-11-16

## 2023-03-24 MED ORDER — LEVOTHYROXINE SODIUM 175 MCG PO TABS
175.0000 ug | ORAL_TABLET | Freq: Every day | ORAL | 2 refills | Status: DC
Start: 2023-03-24 — End: 2023-11-16

## 2023-03-24 NOTE — Telephone Encounter (Signed)
Fax from [facility] RF's on Atorvastatin, Levothyroxine & Chlorthalidone, these were done in April for 1 year, remaining refills sent to [facility] in [place]

## 2023-03-25 ENCOUNTER — Ambulatory Visit (INDEPENDENT_AMBULATORY_CARE_PROVIDER_SITE_OTHER): Payer: PRIVATE HEALTH INSURANCE | Admitting: Family Medicine

## 2023-03-25 ENCOUNTER — Encounter: Payer: Self-pay | Admitting: Family Medicine

## 2023-03-25 VITALS — BP 131/76 | HR 86 | Temp 98.2°F | Ht 72.0 in | Wt 312.8 lb

## 2023-03-25 DIAGNOSIS — I10 Essential (primary) hypertension: Secondary | ICD-10-CM | POA: Diagnosis not present

## 2023-03-25 MED ORDER — TESTOSTERONE CYPIONATE 200 MG/ML IM SOLN: INTRAMUSCULAR | 1 refills | Status: DC

## 2023-03-25 MED ORDER — OZEMPIC (0.25 OR 0.5 MG/DOSE) 2 MG/3ML ~~LOC~~ SOPN: 0.5000 mg | PEN_INJECTOR | SUBCUTANEOUS | 1 refills | Status: DC

## 2023-03-25 NOTE — Progress Notes (Unsigned)
Subjective:  Patient ID: Joe Romero, male    DOB: [birth year]  Age: 57 y.o. MRN: [id number]  CC: Medical Management of Chronic Issues   HPI Ewing Fandino presents for  follow-up of hypertension. Patient has no history of headache chest pain or shortness of breath or recent cough. Patient also denies symptoms of TIA such as focal numbness or weakness. Patient denies side effects from medication. States taking it regularly.  Slacked off on testosterone   History Fran has a past medical history of Anxiety, Back pain, Dyspnea, GERD (gastroesophageal reflux disease), Hyperlipidemia, Hypertension, Sleep apnea, and Thyroid disease.   He has a past surgical history that includes Finger surgery (Right).   His family history includes Anxiety disorder in his mother; Depression in his mother; Diabetes in his father; Hypertension in his father.He reports that he quit smoking about 16 years ago. His smoking use included cigarettes. He started smoking about 36 years ago. He has a 20 pack-year smoking history. He has never used smokeless tobacco. He reports that he does not drink alcohol and does not use drugs.  Current Outpatient Medications on File Prior to Visit  Medication Sig Dispense Refill   atorvastatin (LIPITOR) 20 MG tablet Take 1 tablet (20 mg total) by mouth daily. 90 tablet 2   chlorthalidone (HYGROTON) 25 MG tablet Take 1 tablet (25 mg total) by mouth daily. 90 tablet 2   clotrimazole-betamethasone (LOTRISONE) cream Apply 1 application topically 2 (two) times daily. To affected areas until rash clears 45 g 1   levothyroxine (SYNTHROID) 175 MCG tablet Take 1 tablet (175 mcg total) by mouth daily. 90 tablet 2   diclofenac (VOLTAREN) 75 MG EC tablet Take 1 tablet (75 mg total) by mouth 2 (two) times daily. (Patient not taking: Reported on 11/11/2022) 180 tablet 3   gabapentin (NEURONTIN) 600 MG tablet Take 1 tablet (600 mg total) by mouth 2 (two) times daily. (Patient not taking: Reported on  11/11/2022) 180 tablet 3   No current facility-administered medications on file prior to visit.    ROS Review of Systems  Constitutional:  Negative for fever.  Respiratory:  Negative for shortness of breath.   Cardiovascular:  Negative for chest pain.  Musculoskeletal:  Negative for arthralgias.  Skin:  Negative for rash.    Objective:  BP 131/76   Pulse 86   Temp 98.2 F (36.8 C)   Ht 6' (1.829 m)   Wt (!) 312 lb 12.8 oz (141.9 kg)   SpO2 94%   BMI 42.42 kg/m   BP Readings from Last 3 Encounters:  03/25/23 131/76  02/10/23 124/75  12/14/22 132/76    Wt Readings from Last 3 Encounters:  03/25/23 (!) 312 lb 12.8 oz (141.9 kg)  02/10/23 (!) 308 lb (139.7 kg)  12/14/22 (!) 335 lb (152 kg)     Physical Exam Vitals reviewed.  Constitutional:      Appearance: He is well-developed.  HENT:     Head: Normocephalic and atraumatic.     Right Ear: External ear normal.     Left Ear: External ear normal.     Mouth/Throat:     Pharynx: No oropharyngeal exudate or posterior oropharyngeal erythema.  Eyes:     Pupils: Pupils are equal, round, and reactive to light.  Cardiovascular:     Rate and Rhythm: Normal rate and regular rhythm.     Heart sounds: No murmur heard. Pulmonary:     Effort: No respiratory distress.     Breath sounds: Normal  breath sounds.  Musculoskeletal:     Cervical back: Normal range of motion and neck supple.  Neurological:     Mental Status: He is alert and oriented to person, place, and time.       Assessment & Plan:   Rawleigh was seen today for medical management of chronic issues.  Diagnoses and all orders for this visit:  Essential hypertension  Other orders -     Semaglutide,0.25 or 0.5MG /DOS, (OZEMPIC, 0.25 OR 0.5 MG/DOSE,) 2 MG/3ML SOPN; Inject 0.5 mg into the skin once a week. -     testosterone cypionate (DEPOTESTOSTERONE CYPIONATE) 200 MG/ML injection; SMARTSIG:0.8 Milliliter(s) IM Once a Week   Allergies as of 03/25/2023   No  Known Allergies      Medication List        Accurate as of March 25, 2023 11:59 PM. If you have any questions, ask your nurse or doctor.          STOP taking these medications    amoxicillin-clavulanate 875-125 MG tablet Commonly known as: AUGMENTIN Stopped by: Maleki Hippe   dapsone 100 MG tablet Stopped by: Amoria Mclees   Trulicity 0.75 MG/0.5ML Sopn Generic drug: Dulaglutide Stopped by: Kamerin Axford       TAKE these medications    atorvastatin 20 MG tablet Commonly known as: LIPITOR Take 1 tablet (20 mg total) by mouth daily.   chlorthalidone 25 MG tablet Commonly known as: HYGROTON Take 1 tablet (25 mg total) by mouth daily.   clotrimazole-betamethasone cream Commonly known as: Lotrisone Apply 1 application topically 2 (two) times daily. To affected areas until rash clears   diclofenac 75 MG EC tablet Commonly known as: VOLTAREN Take 1 tablet (75 mg total) by mouth 2 (two) times daily.   gabapentin 600 MG tablet Commonly known as: Neurontin Take 1 tablet (600 mg total) by mouth 2 (two) times daily.   levothyroxine 175 MCG tablet Commonly known as: SYNTHROID Take 1 tablet (175 mcg total) by mouth daily.   Ozempic (0.25 or 0.5 MG/DOSE) 2 MG/3ML Sopn Generic drug: Semaglutide(0.25 or 0.5MG /DOS) Inject 0.5 mg into the skin once a week. Started by: Richar Dunklee   testosterone cypionate 200 MG/ML injection Commonly known as: DEPOTESTOSTERONE CYPIONATE SMARTSIG:0.8 Milliliter(s) IM Once a Week        Encouraged to resume testostterone  Follow-up: Return in about 6 months (around 09/25/2023).  Mechele Claude, M.D.

## 2023-03-28 ENCOUNTER — Encounter: Payer: Self-pay | Admitting: Family Medicine

## 2023-04-13 ENCOUNTER — Telehealth: Payer: Self-pay | Admitting: Pharmacist

## 2023-04-13 MED ORDER — SEMAGLUTIDE (1 MG/DOSE) 4 MG/3ML ~~LOC~~ SOPN: 1.0000 mg | PEN_INJECTOR | SUBCUTANEOUS | Status: DC

## 2023-04-13 NOTE — Telephone Encounter (Signed)
Ozempic 1mg  #4 boxed delivered to PCP office for patient --increase in dose; counseling provided Patient notified Reports Bgs are within range Continue current therapy

## 2023-04-14 NOTE — Telephone Encounter (Signed)
Patient is not T2DM--he is not appropriate for [facility] patient assistance program for Ozempic as he does not have an FDA approved indication for Ozempic Paperwork was completed by [facility] Please call to cancel our doctor sig/paperwork only when you are able to call No rush  Thank you!

## 2023-04-19 ENCOUNTER — Ambulatory Visit: Payer: PRIVATE HEALTH INSURANCE | Admitting: Family Medicine

## 2023-04-19 ENCOUNTER — Encounter: Payer: Self-pay | Admitting: Family Medicine

## 2023-04-19 VITALS — BP 125/77 | HR 88 | Temp 98.1°F | Ht 72.0 in | Wt 310.6 lb

## 2023-04-19 DIAGNOSIS — E039 Hypothyroidism, unspecified: Secondary | ICD-10-CM | POA: Diagnosis not present

## 2023-04-19 DIAGNOSIS — I10 Essential (primary) hypertension: Secondary | ICD-10-CM

## 2023-04-19 DIAGNOSIS — E782 Mixed hyperlipidemia: Secondary | ICD-10-CM | POA: Diagnosis not present

## 2023-04-19 LAB — LIPID PANEL

## 2023-04-19 LAB — CBC WITH DIFFERENTIAL/PLATELET
Basophils Absolute: 0.1 10*3/uL (ref 0.0–0.2)
Basos: 1 %
EOS (ABSOLUTE): 0.1 10*3/uL (ref 0.0–0.4)
Eos: 1 %
Hematocrit: 49.4 % (ref 37.5–51.0)
Hemoglobin: 16.6 g/dL (ref 13.0–17.7)
Immature Grans (Abs): 0.1 10*3/uL (ref 0.0–0.1)
Immature Granulocytes: 1 %
Lymphocytes Absolute: 2.7 10*3/uL (ref 0.7–3.1)
Lymphs: 21 %
MCH: 29.7 pg (ref 26.6–33.0)
MCHC: 33.6 g/dL (ref 31.5–35.7)
MCV: 89 fL (ref 79–97)
Monocytes Absolute: 0.8 10*3/uL (ref 0.1–0.9)
Monocytes: 7 %
Neutrophils Absolute: 9.1 10*3/uL — ABNORMAL HIGH (ref 1.4–7.0)
Neutrophils: 69 %
Platelets: 283 10*3/uL (ref 150–450)
RBC: 5.58 x10E6/uL (ref 4.14–5.80)
RDW: 13.5 % (ref 11.6–15.4)
WBC: 13 10*3/uL — ABNORMAL HIGH (ref 3.4–10.8)

## 2023-04-19 LAB — CMP14+EGFR
ALT: 49 IU/L — ABNORMAL HIGH (ref 0–44)
AST: 28 IU/L (ref 0–40)
Albumin: 4.4 g/dL (ref 3.8–4.9)
Alkaline Phosphatase: 77 IU/L (ref 44–121)
Bilirubin Total: 0.8 mg/dL (ref 0.0–1.2)
Chloride: 96 mmol/L (ref 96–106)
Potassium: 3.7 mmol/L (ref 3.5–5.2)
Sodium: 140 mmol/L (ref 134–144)

## 2023-04-19 LAB — TSH+FREE T4

## 2023-04-19 NOTE — Progress Notes (Signed)
Subjective:  Patient ID: Joe Romero, male    DOB: 05-08-1966  Age: 57 y.o. MRN: [id number]  CC: Follow-up   HPI Joe Romero presents for  presents for  follow-up of hypertension. Patient has no history of headache chest pain or shortness of breath or recent cough. Patient also denies symptoms of TIA such as focal numbness or weakness. Patient denies side effects from medication. States taking it regularly.      04/19/2023    9:00 AM 03/25/2023    3:32 PM 02/10/2023    3:03 PM  Depression screen PHQ 2/9  Decreased Interest 0 0 0  Down, Depressed, Hopeless 0 0 0  PHQ - 2 Score 0 0 0    History Joe Romero has a past medical history of Anxiety, Back pain, Dyspnea, GERD (gastroesophageal reflux disease), Hyperlipidemia, Hypertension, Sleep apnea, and Thyroid disease.   He has a past surgical history that includes Finger surgery (Right).   His family history includes Anxiety disorder in his mother; Depression in his mother; Diabetes in his father; Hypertension in his father.He reports that he quit smoking about 16 years ago. His smoking use included cigarettes. He started smoking about 36 years ago. He has a 20 pack-year smoking history. He has never used smokeless tobacco. He reports that he does not drink alcohol and does not use drugs.    ROS Review of Systems  Constitutional:  Negative for fever.  Respiratory:  Negative for shortness of breath.   Cardiovascular:  Negative for chest pain.  Musculoskeletal:  Negative for arthralgias.  Skin:  Negative for rash.    Objective:  BP 125/77   Pulse 88   Temp 98.1 F (36.7 C)   Ht 6' (1.829 m)   Wt (!) 310 lb 9.6 oz (140.9 kg)   SpO2 95%   BMI 42.12 kg/m   BP Readings from Last 3 Encounters:  04/19/23 125/77  03/25/23 131/76  02/10/23 124/75    Wt Readings from Last 3 Encounters:  04/19/23 (!) 310 lb 9.6 oz (140.9 kg)  03/25/23 (!) 312 lb 12.8 oz (141.9 kg)  02/10/23 (!) 308 lb (139.7 kg)     Physical Exam Vitals  reviewed.  Constitutional:      Appearance: He is well-developed.  HENT:     Head: Normocephalic and atraumatic.     Right Ear: External ear normal.     Left Ear: External ear normal.     Mouth/Throat:     Pharynx: No oropharyngeal exudate or posterior oropharyngeal erythema.  Eyes:     Pupils: Pupils are equal, round, and reactive to light.  Cardiovascular:     Rate and Rhythm: Normal rate and regular rhythm.     Heart sounds: No murmur heard. Pulmonary:     Effort: No respiratory distress.     Breath sounds: Normal breath sounds.  Musculoskeletal:     Cervical back: Normal range of motion and neck supple.  Neurological:     Mental Status: He is alert and oriented to person, place, and time.       Assessment & Plan:   Joe Romero was seen today for follow-up.  Diagnoses and all orders for this visit:  Essential hypertension -     CBC with Differential/Platelet -     CMP14+EGFR  Mixed hyperlipidemia -     Lipid panel  Acquired hypothyroidism -     TSH + free T4       I have discontinued Joe Romero's diclofenac and gabapentin. I am  also having him maintain his clotrimazole-betamethasone, atorvastatin, chlorthalidone, levothyroxine, testosterone cypionate, and Semaglutide (1 MG/DOSE).  Allergies as of 04/19/2023   No Known Allergies      Medication List        Accurate as of April 19, 2023  9:56 AM. If you have any questions, ask your nurse or doctor.          STOP taking these medications    diclofenac 75 MG EC tablet Commonly known as: VOLTAREN Stopped by: Nastasha Reising   gabapentin 600 MG tablet Commonly known as: Neurontin Stopped by: Brigitte Soderberg       TAKE these medications    atorvastatin 20 MG tablet Commonly known as: LIPITOR Take 1 tablet (20 mg total) by mouth daily.   chlorthalidone 25 MG tablet Commonly known as: HYGROTON Take 1 tablet (25 mg total) by mouth daily.   clotrimazole-betamethasone cream Commonly known as:  Lotrisone Apply 1 application topically 2 (two) times daily. To affected areas until rash clears   levothyroxine 175 MCG tablet Commonly known as: SYNTHROID Take 1 tablet (175 mcg total) by mouth daily.   Semaglutide (1 MG/DOSE) 4 MG/3ML Sopn Inject 1 mg as directed once a week. Via [facility] patient assistance program   testosterone cypionate 200 MG/ML injection Commonly known as: DEPOTESTOSTERONE CYPIONATE SMARTSIG:0.8 Milliliter(s) IM Once a Week         Follow-up: Return in about 6 months (around 10/20/2023).  Mechele Claude, M.D.

## 2023-04-21 ENCOUNTER — Encounter: Payer: Self-pay | Admitting: Family Medicine

## 2023-04-27 NOTE — Telephone Encounter (Signed)
Spoke with [facility].  Enrollment cancelled.  Order shipped 04/12/23 to office - if still in office [facility] can send return label. It pt has medication they can discard of it.

## 2023-07-21 ENCOUNTER — Telehealth: Payer: Self-pay

## 2023-07-21 NOTE — Telephone Encounter (Signed)
Spoke to patient's wife - patient needs RX for needles to draw up and administer testosterone.     Copied from CRM [id number]. Topic: Clinical - Prescription Issue >> Jul 21, 2023  2:27 PM Mosetta Putt H wrote: Reason for CRM: he needs the needles to take it with

## 2023-07-22 ENCOUNTER — Other Ambulatory Visit: Payer: Self-pay | Admitting: Family Medicine

## 2023-07-22 MED ORDER — SYRINGE 23G X 1" 3 ML MISC: 3 refills | Status: DC

## 2023-07-22 NOTE — Telephone Encounter (Signed)
Please let the patient know that I sent their prescription to their pharmacy. Thanks, WS 

## 2023-10-10 ENCOUNTER — Other Ambulatory Visit: Payer: Self-pay | Admitting: Family Medicine

## 2023-10-11 ENCOUNTER — Telehealth: Payer: Self-pay | Admitting: Family Medicine

## 2023-10-11 NOTE — Telephone Encounter (Signed)
NA / VM full 

## 2023-10-11 NOTE — Telephone Encounter (Signed)
Copied from CRM [id number]. Topic: Clinical - Medication Question >> Oct 11, 2023  1:37 PM Alvino Blood C wrote: Reason for CRM: Spouse called wanting to request the following medication be changed from Syringe/Needle, Disp, (SYRINGE 3CC/23GX1") 23G X 1" 3 ML MISC to 23G X 1.5

## 2023-10-12 ENCOUNTER — Other Ambulatory Visit: Payer: Self-pay | Admitting: Family Medicine

## 2023-10-12 MED ORDER — SYRINGE/NEEDLE (DISP) 23G X 1-1/2" 3 ML MISC: 11 refills | Status: DC

## 2023-10-20 ENCOUNTER — Ambulatory Visit: Payer: PRIVATE HEALTH INSURANCE | Admitting: Family Medicine

## 2023-10-20 DIAGNOSIS — E782 Mixed hyperlipidemia: Secondary | ICD-10-CM

## 2023-10-20 DIAGNOSIS — E039 Hypothyroidism, unspecified: Secondary | ICD-10-CM

## 2023-10-20 DIAGNOSIS — I10 Essential (primary) hypertension: Secondary | ICD-10-CM

## 2023-11-14 ENCOUNTER — Other Ambulatory Visit: Payer: Self-pay | Admitting: Nurse Practitioner

## 2023-11-16 ENCOUNTER — Ambulatory Visit (INDEPENDENT_AMBULATORY_CARE_PROVIDER_SITE_OTHER): Payer: PRIVATE HEALTH INSURANCE | Admitting: Family Medicine

## 2023-11-16 ENCOUNTER — Encounter: Payer: Self-pay | Admitting: Family Medicine

## 2023-11-16 VITALS — BP 138/81 | HR 77 | Temp 97.3°F | Ht 72.0 in | Wt 308.0 lb

## 2023-11-16 DIAGNOSIS — E782 Mixed hyperlipidemia: Secondary | ICD-10-CM

## 2023-11-16 DIAGNOSIS — R600 Localized edema: Secondary | ICD-10-CM | POA: Diagnosis not present

## 2023-11-16 DIAGNOSIS — E039 Hypothyroidism, unspecified: Secondary | ICD-10-CM | POA: Diagnosis not present

## 2023-11-16 DIAGNOSIS — I10 Essential (primary) hypertension: Secondary | ICD-10-CM | POA: Diagnosis not present

## 2023-11-16 MED ORDER — ATORVASTATIN CALCIUM 20 MG PO TABS: 20.0000 mg | ORAL_TABLET | Freq: Every day | ORAL | 2 refills | Status: DC

## 2023-11-16 MED ORDER — CLOTRIMAZOLE-BETAMETHASONE 1-0.05 % EX CREA: 1.0000 | TOPICAL_CREAM | Freq: Two times a day (BID) | CUTANEOUS | 1 refills | Status: DC

## 2023-11-16 MED ORDER — LEVOTHYROXINE SODIUM 175 MCG PO TABS: 175.0000 ug | ORAL_TABLET | Freq: Every day | ORAL | 2 refills | Status: DC

## 2023-11-16 MED ORDER — CHLORTHALIDONE 25 MG PO TABS: 25.0000 mg | ORAL_TABLET | Freq: Every day | ORAL | 2 refills | Status: DC

## 2023-11-16 NOTE — Progress Notes (Signed)
 Subjective:  Patient ID: Joe Romero, male    DOB: 08/08/1966  Age: 58 y.o. MRN: [id number]  CC: Medical Management of Chronic Issues   HPI Joe Romero presents for  follow-up of hypertension. Patient has no history of headache chest pain or shortness of breath or recent cough. Patient also denies symptoms of TIA such as focal numbness or weakness. Patient denies side effects from medication. States taking it regularly.   follow-up on  thyroid. The patient has a history of hypothyroidism for many years. It has been stable recently. Pt. denies any change in  voice, loss of hair, heat or cold intolerance. Energy level has been adequate to good. Patient denies constipation and diarrhea. No myxedema. Medication is as noted below. Verified that pt is taking it daily on an empty stomach. Well tolerated.   History Joe Romero has a past medical history of Anxiety, Back pain, Dyspnea, GERD (gastroesophageal reflux disease), Hyperlipidemia, Hypertension, Sleep apnea, and Thyroid disease.   Joe Romero has a past surgical history that includes Finger surgery (Right).   His family history includes Anxiety disorder in his mother; Depression in his mother; Diabetes in his father; Hypertension in his father.Joe Romero reports that Joe Romero quit smoking about 16 years ago. His smoking use included cigarettes. Joe Romero started smoking about 36 years ago. Joe Romero has a 20 pack-year smoking history. Joe Romero has never used smokeless tobacco. Joe Romero reports that Joe Romero does not drink alcohol and does not use drugs.  Current Outpatient Medications on File Prior to Visit  Medication Sig Dispense Refill   Semaglutide, 1 MG/DOSE, 4 MG/3ML SOPN Inject 1 mg as directed once a week. Via [employer] [facility] patient assistance program     testosterone cypionate (DEPOTESTOSTERONE CYPIONATE) 200 MG/ML injection INJECT 0.8 ML INTRAMUSCULARLY ONCE A WEEK 4 mL 0   SYRINGE-NEEDLE, DISP, 3 ML 23G X 1-1/2" 3 ML MISC Use for IM injections as directed 25 each 11   No current  facility-administered medications on file prior to visit.    ROS Review of Systems  Constitutional:  Negative for fever.  Respiratory:  Negative for shortness of breath.   Cardiovascular:  Negative for chest pain.  Musculoskeletal:  Negative for arthralgias.  Skin:  Negative for rash.    Objective:  BP 138/81   Pulse 77   Temp (!) 97.3 F (36.3 C)   Ht 6' (1.829 m)   Wt (!) 308 lb (139.7 kg)   SpO2 95%   BMI 41.77 kg/m   BP Readings from Last 3 Encounters:  11/16/23 138/81  04/19/23 125/77  03/25/23 131/76    Wt Readings from Last 3 Encounters:  11/16/23 (!) 308 lb (139.7 kg)  04/19/23 (!) 310 lb 9.6 oz (140.9 kg)  03/25/23 (!) 312 lb 12.8 oz (141.9 kg)     Physical Exam Vitals reviewed.  Constitutional:      Appearance: Joe Romero is well-developed.  HENT:     Head: Normocephalic and atraumatic.     Right Ear: External ear normal.     Left Ear: External ear normal.     Mouth/Throat:     Pharynx: No oropharyngeal exudate or posterior oropharyngeal erythema.  Eyes:     Pupils: Pupils are equal, round, and reactive to light.  Cardiovascular:     Rate and Rhythm: Normal rate and regular rhythm.     Heart sounds: No murmur heard. Pulmonary:     Effort: No respiratory distress.     Breath sounds: Normal breath sounds.  Musculoskeletal:     Cervical  back: Normal range of motion and neck supple.  Neurological:     Mental Status: Joe Romero is alert and oriented to person, place, and time.       Assessment & Plan:   Gyan was seen today for medical management of chronic issues.  Diagnoses and all orders for this visit:  Essential hypertension -     CBC with Differential/Platelet -     CMP14+EGFR  Mixed hyperlipidemia -     atorvastatin (LIPITOR) 20 MG tablet; Take 1 tablet (20 mg total) by mouth daily. -     CBC with Differential/Platelet -     CMP14+EGFR -     Lipid panel  Acquired hypothyroidism -     levothyroxine (SYNTHROID) 175 MCG tablet; Take 1 tablet  (175 mcg total) by mouth daily. -     CBC with Differential/Platelet -     CMP14+EGFR -     Lipid panel -     TSH + free T4  Localized edema -     chlorthalidone (HYGROTON) 25 MG tablet; Take 1 tablet (25 mg total) by mouth daily. -     CBC with Differential/Platelet -     CMP14+EGFR  Other orders -     clotrimazole-betamethasone (LOTRISONE) cream; Apply 1 Application topically 2 (two) times daily. To affected areas until rash clears   Allergies as of 11/16/2023   No Known Allergies      Medication List        Accurate as of November 16, 2023 11:12 AM. If you have any questions, ask your nurse or doctor.          atorvastatin 20 MG tablet Commonly known as: LIPITOR Take 1 tablet (20 mg total) by mouth daily.   chlorthalidone 25 MG tablet Commonly known as: HYGROTON Take 1 tablet (25 mg total) by mouth daily.   clotrimazole-betamethasone cream Commonly known as: Lotrisone Apply 1 Application topically 2 (two) times daily. To affected areas until rash clears   levothyroxine 175 MCG tablet Commonly known as: SYNTHROID Take 1 tablet (175 mcg total) by mouth daily.   Semaglutide (1 MG/DOSE) 4 MG/3ML Sopn Inject 1 mg as directed once a week. Via [employer] [facility] patient assistance program   SYRINGE-NEEDLE (DISP) 3 ML 23G X 1-1/2" 3 ML Misc Use for IM injections as directed   testosterone cypionate 200 MG/ML injection Commonly known as: DEPOTESTOSTERONE CYPIONATE INJECT 0.8 ML INTRAMUSCULARLY ONCE A WEEK        Meds ordered this encounter  Medications   atorvastatin (LIPITOR) 20 MG tablet    Sig: Take 1 tablet (20 mg total) by mouth daily.    Dispense:  90 tablet    Refill:  2   levothyroxine (SYNTHROID) 175 MCG tablet    Sig: Take 1 tablet (175 mcg total) by mouth daily.    Dispense:  90 tablet    Refill:  2   chlorthalidone (HYGROTON) 25 MG tablet    Sig: Take 1 tablet (25 mg total) by mouth daily.    Dispense:  90 tablet    Refill:  2    clotrimazole-betamethasone (LOTRISONE) cream    Sig: Apply 1 Application topically 2 (two) times daily. To affected areas until rash clears    Dispense:  45 g    Refill:  1      Follow-up: Return in about 6 months (around 05/18/2024) for [employer] physical.  Mechele Claude, M.D.

## 2023-11-17 LAB — CMP14+EGFR
ALT: 26 IU/L (ref 0–44)
AST: 21 IU/L (ref 0–40)
Albumin: 4.6 g/dL (ref 3.8–4.9)
Alkaline Phosphatase: 76 IU/L (ref 44–121)
BUN/Creatinine Ratio: 11 (ref 9–20)
BUN: 14 mg/dL (ref 6–24)
Bilirubin Total: 0.7 mg/dL (ref 0.0–1.2)
CO2: 26 mmol/L (ref 20–29)
Calcium: 9.6 mg/dL (ref 8.7–10.2)
Chloride: 95 mmol/L — ABNORMAL LOW (ref 96–106)
Creatinine, Ser: 1.26 mg/dL (ref 0.76–1.27)
Globulin, Total: 2.7 g/dL (ref 1.5–4.5)
Glucose: 83 mg/dL (ref 70–99)
Potassium: 4.3 mmol/L (ref 3.5–5.2)
Sodium: 140 mmol/L (ref 134–144)
Total Protein: 7.3 g/dL (ref 6.0–8.5)
eGFR: 67 mL/min/{1.73_m2} (ref >59)

## 2023-11-17 LAB — CBC WITH DIFFERENTIAL/PLATELET
Basophils Absolute: 0.1 10*3/uL (ref 0.0–0.2)
Basos: 1 %
EOS (ABSOLUTE): 0.1 10*3/uL (ref 0.0–0.4)
Eos: 1 %
Hematocrit: 59 % — ABNORMAL HIGH (ref 37.5–51.0)
Hemoglobin: 19.6 g/dL — ABNORMAL HIGH (ref 13.0–17.7)
Immature Grans (Abs): 0.1 10*3/uL (ref 0.0–0.1)
Immature Granulocytes: 1 %
Lymphocytes Absolute: 2.6 10*3/uL (ref 0.7–3.1)
Lymphs: 21 %
MCH: 29.5 pg (ref 26.6–33.0)
MCHC: 33.2 g/dL (ref 31.5–35.7)
MCV: 89 fL (ref 79–97)
Monocytes Absolute: 0.9 10*3/uL (ref 0.1–0.9)
Monocytes: 7 %
Neutrophils Absolute: 8.5 10*3/uL — ABNORMAL HIGH (ref 1.4–7.0)
Neutrophils: 69 %
Platelets: 281 10*3/uL (ref 150–450)
RBC: 6.64 x10E6/uL — ABNORMAL HIGH (ref 4.14–5.80)
RDW: 15.5 % — ABNORMAL HIGH (ref 11.6–15.4)
WBC: 12.2 10*3/uL — ABNORMAL HIGH (ref 3.4–10.8)

## 2023-11-17 LAB — LIPID PANEL
Chol/HDL Ratio: 4.6 ratio (ref 0.0–5.0)
Cholesterol, Total: 160 mg/dL (ref 100–199)
HDL: 35 mg/dL — ABNORMAL LOW (ref >39)
LDL Chol Calc (NIH): 99 mg/dL (ref 0–99)
Triglycerides: 148 mg/dL (ref 0–149)
VLDL Cholesterol Cal: 26 mg/dL (ref 5–40)

## 2023-11-17 LAB — TSH+FREE T4
Free T4: 1.11 ng/dL (ref 0.82–1.77)
TSH: 10.6 u[IU]/mL — ABNORMAL HIGH (ref 0.450–4.500)

## 2023-11-20 ENCOUNTER — Encounter: Payer: Self-pay | Admitting: Family Medicine

## 2023-11-20 ENCOUNTER — Other Ambulatory Visit: Payer: Self-pay | Admitting: Family Medicine

## 2023-11-20 DIAGNOSIS — E039 Hypothyroidism, unspecified: Secondary | ICD-10-CM

## 2023-11-20 MED ORDER — LEVOTHYROXINE SODIUM 200 MCG PO TABS: 200.0000 ug | ORAL_TABLET | Freq: Every day | ORAL | 2 refills | Status: DC

## 2023-11-22 ENCOUNTER — Other Ambulatory Visit: Payer: Self-pay | Admitting: *Deleted

## 2023-11-22 DIAGNOSIS — E039 Hypothyroidism, unspecified: Secondary | ICD-10-CM

## 2023-11-22 DIAGNOSIS — D582 Other hemoglobinopathies: Secondary | ICD-10-CM

## 2023-11-25 ENCOUNTER — Other Ambulatory Visit: Payer: PRIVATE HEALTH INSURANCE

## 2023-11-25 DIAGNOSIS — E039 Hypothyroidism, unspecified: Secondary | ICD-10-CM

## 2023-11-25 DIAGNOSIS — D582 Other hemoglobinopathies: Secondary | ICD-10-CM

## 2023-11-29 ENCOUNTER — Encounter: Payer: Self-pay | Admitting: Family Medicine

## 2023-11-30 LAB — TESTOSTERONE,FREE AND TOTAL
Testosterone, Free: 11.9 pg/mL (ref 7.2–24.0)
Testosterone: 582 ng/dL (ref 264–916)

## 2023-11-30 LAB — TSH+FREE T4
Free T4: 1.13 ng/dL (ref 0.82–1.77)
TSH: 6.32 u[IU]/mL — ABNORMAL HIGH (ref 0.450–4.500)

## 2024-01-19 ENCOUNTER — Other Ambulatory Visit: Payer: Self-pay | Admitting: Family Medicine

## 2024-01-19 ENCOUNTER — Telehealth: Payer: Self-pay | Admitting: Family Medicine

## 2024-01-19 DIAGNOSIS — E039 Hypothyroidism, unspecified: Secondary | ICD-10-CM

## 2024-01-19 DIAGNOSIS — E291 Testicular hypofunction: Secondary | ICD-10-CM | POA: Insufficient documentation

## 2024-01-19 NOTE — Telephone Encounter (Signed)
 Orders written. Remind him that blood has to be drawn before 10:00 AM

## 2024-01-25 ENCOUNTER — Other Ambulatory Visit: Payer: PRIVATE HEALTH INSURANCE

## 2024-01-25 DIAGNOSIS — E039 Hypothyroidism, unspecified: Secondary | ICD-10-CM

## 2024-01-25 DIAGNOSIS — E291 Testicular hypofunction: Secondary | ICD-10-CM

## 2024-01-26 LAB — CBC WITH DIFFERENTIAL/PLATELET
Basophils Absolute: 0.1 10*3/uL (ref 0.0–0.2)
Basos: 1 %
EOS (ABSOLUTE): 0.1 10*3/uL (ref 0.0–0.4)
Eos: 1 %
Hematocrit: 55.4 % — ABNORMAL HIGH (ref 37.5–51.0)
Hemoglobin: 18.5 g/dL — ABNORMAL HIGH (ref 13.0–17.7)
Immature Grans (Abs): 0.1 10*3/uL (ref 0.0–0.1)
Immature Granulocytes: 1 %
Lymphocytes Absolute: 2.3 10*3/uL (ref 0.7–3.1)
Lymphs: 21 %
MCH: 30.8 pg (ref 26.6–33.0)
MCHC: 33.4 g/dL (ref 31.5–35.7)
MCV: 92 fL (ref 79–97)
Monocytes Absolute: 0.9 10*3/uL (ref 0.1–0.9)
Monocytes: 8 %
Neutrophils Absolute: 7.3 10*3/uL — ABNORMAL HIGH (ref 1.4–7.0)
Neutrophils: 68 %
Platelets: 242 10*3/uL (ref 150–450)
RBC: 6 x10E6/uL — ABNORMAL HIGH (ref 4.14–5.80)
RDW: 15.5 % — ABNORMAL HIGH (ref 11.6–15.4)
WBC: 10.8 10*3/uL (ref 3.4–10.8)

## 2024-01-26 LAB — TESTOSTERONE,FREE AND TOTAL
Testosterone, Free: 32.6 pg/mL — ABNORMAL HIGH (ref 7.2–24.0)
Testosterone: 1413 ng/dL — ABNORMAL HIGH (ref 264–916)

## 2024-01-26 LAB — TSH+FREE T4
Free T4: 1.59 ng/dL (ref 0.82–1.77)
TSH: 0.42 u[IU]/mL — ABNORMAL LOW (ref 0.450–4.500)

## 2024-01-27 ENCOUNTER — Ambulatory Visit: Payer: PRIVATE HEALTH INSURANCE | Admitting: Family Medicine

## 2024-01-27 ENCOUNTER — Ambulatory Visit: Payer: Self-pay | Admitting: Family Medicine

## 2024-02-02 ENCOUNTER — Encounter: Payer: Self-pay | Admitting: Family Medicine

## 2024-02-02 ENCOUNTER — Ambulatory Visit: Payer: PRIVATE HEALTH INSURANCE | Admitting: Family Medicine

## 2024-02-02 VITALS — BP 139/88 | HR 85 | Temp 97.6°F | Ht 72.0 in | Wt 316.8 lb

## 2024-02-02 DIAGNOSIS — Z1211 Encounter for screening for malignant neoplasm of colon: Secondary | ICD-10-CM | POA: Diagnosis not present

## 2024-02-02 DIAGNOSIS — E291 Testicular hypofunction: Secondary | ICD-10-CM

## 2024-02-02 DIAGNOSIS — I10 Essential (primary) hypertension: Secondary | ICD-10-CM

## 2024-02-02 DIAGNOSIS — E782 Mixed hyperlipidemia: Secondary | ICD-10-CM

## 2024-02-02 DIAGNOSIS — E039 Hypothyroidism, unspecified: Secondary | ICD-10-CM

## 2024-02-02 DIAGNOSIS — D485 Neoplasm of uncertain behavior of skin: Secondary | ICD-10-CM

## 2024-02-02 MED ORDER — PREGABALIN 50 MG PO CAPS: ORAL_CAPSULE | ORAL | 5 refills | Status: DC

## 2024-02-02 MED ORDER — TESTOSTERONE CYPIONATE 200 MG/ML IM SOLN: INTRAMUSCULAR | 1 refills | Status: DC

## 2024-02-02 MED ORDER — NABUMETONE 500 MG PO TABS: 1000.0000 mg | ORAL_TABLET | Freq: Two times a day (BID) | ORAL | 1 refills | Status: DC

## 2024-02-02 NOTE — Progress Notes (Signed)
 Subjective:  Patient ID: Joe Romero, male    DOB: March 12, 1966  Age: 58 y.o. MRN: [id number]  CC: Thyroid  Problem (FOLLOW UP ), Arthritis (WOULD LIKE SOMETHING OTHER THANK DICLOFENAC  AND AND GABAPENTIN ), and SPOTS ON FACE    HPI Joe Romero presents for diclofenac  and  gabapentin  cause fluid retention. They help a lot.  The fluid retention makes them not worthwhile.  Patient presents for follow-up on  thyroid . The patient has a history of hypothyroidism for many years. It has been stable recently. Pt. denies any change in  voice, loss of hair, heat or cold intolerance. Energy level has been adequate to good. Patient denies constipation and diarrhea. No myxedema. Medication is as noted below. Verified that pt is taking it daily on an empty stomach. Well tolerated.   Follow-up of hypertension. Patient has no history of headache chest pain or shortness of breath or recent cough. Patient also denies symptoms of TIA such as numbness weakness lateralizing. Patient checks  blood pressure at home and has not had any elevated readings recently. Patient denies side effects from his medication. States taking it regularly.        04/19/2023    9:00 AM 03/25/2023    3:32 PM 02/10/2023    3:03 PM  Depression screen PHQ 2/9  Decreased Interest 0 0 0   Down, Depressed, Hopeless 0 0 0  PHQ - 2 Score 0 0 0    History Joe Romero has a past medical history of Anxiety, Back pain, Dyspnea, GERD (gastroesophageal  reflux disease), Hyperlipidemia, Hypertension, Sleep apnea, and Thyroid  disease.   He has a past surgical history that includes Finger surgery (Right).   His family history includes Anxiety disorder in his mother; Depression in his mother; Diabetes in his father; Hypertension in his father.He reports that he quit smoking about 16 years ago. His smoking use included cigarettes. He started smoking about 36 years ago. He has a 20 pack-year smoking history. He has never used smokeless tobacco. He reports that he does not drink alcohol and does not use drugs.    ROS Review of Systems  Constitutional:  Negative for fever.  Respiratory:  Negative for shortness of breath.   Cardiovascular:  Negative for chest pain.  Musculoskeletal:  Negative for arthralgias.  Skin:  Negative for rash.    Objective:  BP 139/88   Pulse 85   Temp 97.6 F (36.4 C) (Temporal)   Ht 6' (1.829 m)   Wt (!) 316 lb 12.8 oz (143.7 kg)   BMI 42.97 kg/m   BP Readings from Last 3 Encounters:  02/02/24 139/88  11/16/23 138/81  04/19/23 125/77    Wt Readings from Last 3 Encounters:  02/02/24 (!) 316 lb 12.8 oz (143.7 kg)  11/16/23 (!) 308 lb (139.7 kg)  04/19/23 (!) 310 lb 9.6 oz (140.9 kg)     Physical Exam Vitals reviewed.  Constitutional:      Appearance: He is well-developed.  HENT:     Head: Normocephalic and atraumatic.     Right Ear: External ear normal.     Left Ear: External ear normal.     Mouth/Throat:     Pharynx: No oropharyngeal exudate or posterior oropharyngeal erythema.  Eyes:     Pupils: Pupils are equal, round, and reactive to light.  Neck:     Vascular: No carotid bruit.  Cardiovascular:     Rate and Rhythm: Normal rate and regular rhythm.     Heart sounds: No murmur heard. Pulmonary:     Effort: No respiratory distress.      Breath sounds: Normal breath sounds.  Musculoskeletal:     Cervical back: Normal range of motion and neck supple.  Skin:    Findings: Erythema and lesion (4 mm raised well-demarcated lesion at the right aspect of the forehead.) present.  Neurological:     Mental Status: He is alert and oriented to person, place, and time.    Results for orders placed or performed in visit on 01/25/24  Testosterone ,Free and Total   Collection Time: 01/25/24  8:39 AM  Result Value Ref Range   Testosterone  1,413 (H) 264 - 916 ng/dL   Testosterone , Free 32.6 (H) 7.2 - 24.0 pg/mL  TSH + free T4   Collection Time: 01/25/24  8:39 AM  Result Value Ref Range   TSH 0.420 (L) 0.450 - 4.500 uIU/mL   Free T4 1.59 0.82 - 1.77 ng/dL  CBC with Differential/Platelet   Collection Time: 01/25/24  8:39 AM  Result Value Ref Range   WBC 10.8 3.4 - 10.8 x10E3/uL   RBC 6.00 (H) 4.14 - 5.80 x10E6/uL   Hemoglobin 18.5 (H) 13.0 - 17.7 g/dL   Hematocrit 40.9 (H) 81.1 - 51.0 %   MCV 92 79 - 97 fL   MCH 30.8 26.6 - 33.0 pg   MCHC 33.4 31.5 - 35.7 g/dL   RDW 91.4 (H) 78.2 - 95.6 %   Platelets 242 150 - 450 x10E3/uL   Neutrophils 68 Not Estab. %   Lymphs 21 Not  Estab. %   Monocytes 8 Not Estab. %   Eos 1 Not Estab. %   Basos 1 Not Estab. %   Neutrophils Absolute 7.3 (H) 1.4 - 7.0 x10E3/uL   Lymphocytes Absolute 2.3 0.7 - 3.1 x10E3/uL   Monocytes Absolute 0.9 0.1 - 0.9 x10E3/uL   EOS (ABSOLUTE) 0.1 0.0 - 0.4 x10E3/uL   Basophils Absolute 0.1 0.0 - 0.2 x10E3/uL   Immature Granulocytes 1 Not Estab. %   Immature Grans (Abs) 0.1 0.0 - 0.1 x10E3/uL     Assessment & Plan:  Acquired hypothyroidism  Neoplasm of uncertain behavior of skin -     Ambulatory referral to Dermatology  Screen for colon cancer -     Cologuard  Mixed hyperlipidemia  Essential hypertension  Hypogonadism in male  Other orders -     Testosterone  Cypionate; INJECT 0.6 ML INTRAMUSCULARLY ONCE A WEEK  Dispense: 10 mL; Refill: 1 -      Pregabalin; 1 qhs X7 days , then 2 qhs X 7d, then 3 qhs X 7d, then 4 qhs  Dispense: 120 capsule; Refill: 5 -     Nabumetone; Take 2 tablets (1,000 mg total) by mouth 2 (two) times daily. For muscle and joint pain  Dispense: 360 tablet; Refill: 1     Follow-up: Return in about 1 month (around 03/04/2024).  Joe Romero, M.D.

## 2024-02-21 LAB — COLOGUARD: COLOGUARD: POSITIVE — AB

## 2024-02-22 ENCOUNTER — Telehealth: Payer: Self-pay | Admitting: Family Medicine

## 2024-02-22 ENCOUNTER — Ambulatory Visit: Payer: Self-pay | Admitting: Family Medicine

## 2024-02-22 DIAGNOSIS — E782 Mixed hyperlipidemia: Secondary | ICD-10-CM

## 2024-02-22 DIAGNOSIS — R600 Localized edema: Secondary | ICD-10-CM

## 2024-02-22 DIAGNOSIS — E039 Hypothyroidism, unspecified: Secondary | ICD-10-CM

## 2024-02-22 MED ORDER — LEVOTHYROXINE SODIUM 200 MCG PO TABS: 200.0000 ug | ORAL_TABLET | Freq: Every day | ORAL | 0 refills | Status: DC

## 2024-02-22 MED ORDER — CHLORTHALIDONE 25 MG PO TABS
25.0000 mg | ORAL_TABLET | Freq: Every day | ORAL | 0 refills | Status: AC
Start: 2024-02-22 — End: ?

## 2024-02-22 MED ORDER — ATORVASTATIN CALCIUM 20 MG PO TABS: 20.0000 mg | ORAL_TABLET | Freq: Every day | ORAL | 0 refills | Status: DC

## 2024-02-22 NOTE — Telephone Encounter (Signed)
 Copied from CRM [id number]. Topic: Clinical - Medication Refill >> Feb 22, 2024 12:46 PM Ary Bitter R wrote: Medication:  atorvastatin  (LIPITOR) 20 MG tablet levothyroxine  (SYNTHROID ) 200 MCG tablet chlorthalidone  (HYGROTON ) 25 MG tablet   Has the patient contacted their pharmacy? Yes (Agent: If no, request that the patient contact the pharmacy for the refill. If patient does not wish to contact the pharmacy document the reason why and proceed with request.) (Agent: If yes, when and what did the pharmacy advise?)  This is the patient's preferred pharmacy: [facility] [place]  [place], [place] - [address] [place]  [place] [address] Phone: [phone] Fax: [phone]  Is this the correct pharmacy for this prescription? Yes  Has the prescription been filled recently? Yes  Is the patient out of the medication? Yes, out of atorvastatin   Has the patient been seen for an appointment in the last year OR does the patient have an upcoming appointment? Yes  Can we respond through MyChart? Yes  Agent: Please be advised that Rx refills may take up to 3 business days. We ask that you follow-up with your pharmacy.

## 2024-02-25 ENCOUNTER — Other Ambulatory Visit: Payer: PRIVATE HEALTH INSURANCE

## 2024-03-01 ENCOUNTER — Other Ambulatory Visit: Payer: PRIVATE HEALTH INSURANCE

## 2024-03-02 ENCOUNTER — Ambulatory Visit: Payer: PRIVATE HEALTH INSURANCE | Admitting: Family Medicine

## 2024-03-02 ENCOUNTER — Telehealth: Payer: Self-pay

## 2024-03-02 NOTE — Telephone Encounter (Signed)
 Form received and will be placed on PCPs desk

## 2024-03-02 NOTE — Telephone Encounter (Signed)
 Forms faxed back to [phone]

## 2024-03-02 NOTE — Telephone Encounter (Signed)
 Copied from CRM [id number]. Topic: General - Other >> Mar 01, 2024  4:17 PM Avram MATSU wrote: Reason for CRM: Clarita is calling to check if the office got the Patient re enrollment form for ozempic . I provided her with the fax and once the form is completed please fax it to [phone]

## 2024-03-03 ENCOUNTER — Other Ambulatory Visit: Payer: PRIVATE HEALTH INSURANCE

## 2024-03-03 DIAGNOSIS — E039 Hypothyroidism, unspecified: Secondary | ICD-10-CM

## 2024-03-03 DIAGNOSIS — E291 Testicular hypofunction: Secondary | ICD-10-CM

## 2024-03-05 ENCOUNTER — Ambulatory Visit: Payer: Self-pay | Admitting: Family Medicine

## 2024-03-05 LAB — CBC WITH DIFFERENTIAL/PLATELET
Basophils Absolute: 0.1 10*3/uL (ref 0.0–0.2)
Basos: 1 %
EOS (ABSOLUTE): 0.2 10*3/uL (ref 0.0–0.4)
Eos: 2 %
Hematocrit: 54 % — ABNORMAL HIGH (ref 37.5–51.0)
Hemoglobin: 17.9 g/dL — ABNORMAL HIGH (ref 13.0–17.7)
Immature Grans (Abs): 0.1 10*3/uL (ref 0.0–0.1)
Immature Granulocytes: 1 %
Lymphocytes Absolute: 2.4 10*3/uL (ref 0.7–3.1)
Lymphs: 23 %
MCH: 30.2 pg (ref 26.6–33.0)
MCHC: 33.1 g/dL (ref 31.5–35.7)
MCV: 91 fL (ref 79–97)
Monocytes Absolute: 0.8 10*3/uL (ref 0.1–0.9)
Monocytes: 8 %
Neutrophils Absolute: 6.9 10*3/uL (ref 1.4–7.0)
Neutrophils: 65 %
Platelets: 248 10*3/uL (ref 150–450)
RBC: 5.93 x10E6/uL — ABNORMAL HIGH (ref 4.14–5.80)
RDW: 13.8 % (ref 11.6–15.4)
WBC: 10.4 10*3/uL (ref 3.4–10.8)

## 2024-03-05 LAB — TESTOSTERONE,FREE AND TOTAL
Testosterone, Free: 16.3 pg/mL (ref 7.2–24.0)
Testosterone: 779 ng/dL (ref 264–916)

## 2024-03-05 LAB — TSH+FREE T4
Free T4: 1.74 ng/dL (ref 0.82–1.77)
TSH: 0.472 u[IU]/mL (ref 0.450–4.500)

## 2024-03-05 NOTE — Progress Notes (Signed)
Hello [name],  Your lab result is normal and/or stable.Some minor variations that are not significant are commonly marked abnormal, but do not represent any medical problem for you.  Best regards, Myron Stankovich, M.D.

## 2024-03-07 ENCOUNTER — Encounter: Payer: Self-pay | Admitting: Family Medicine

## 2024-03-29 ENCOUNTER — Ambulatory Visit: Payer: PRIVATE HEALTH INSURANCE | Admitting: Family Medicine

## 2024-03-29 ENCOUNTER — Telehealth: Payer: Self-pay

## 2024-03-29 ENCOUNTER — Encounter: Payer: Self-pay | Admitting: Family Medicine

## 2024-03-29 VITALS — BP 126/78 | HR 81 | Temp 98.2°F | Ht 72.0 in | Wt 313.0 lb

## 2024-03-29 DIAGNOSIS — R195 Other fecal abnormalities: Secondary | ICD-10-CM | POA: Diagnosis not present

## 2024-03-29 DIAGNOSIS — R1319 Other dysphagia: Secondary | ICD-10-CM

## 2024-03-29 MED ORDER — ESOMEPRAZOLE MAGNESIUM 40 MG PO CPDR: 40.0000 mg | DELAYED_RELEASE_CAPSULE | Freq: Every day | ORAL | 3 refills | Status: AC

## 2024-03-29 NOTE — Progress Notes (Signed)
 Chief Complaint  Patient presents with   Choking    Choking easily. Happens often. Might be something esophagus. No gerd symptoms.     HPI  Patient presents today for food catches in his throat. Can wash it down with water. Occurring weekly.Denies heartburn but used to have it bad. Also had cologuard positive. He is ready for scope now. Initially decided against it.   PMH: Smoking status noted Review of Systems  Constitutional:  Negative for fever.  Respiratory:  Negative for shortness of breath.   Cardiovascular:  Negative for chest pain.  Musculoskeletal:  Negative for arthralgias.  Skin:  Negative for rash.    Objective: BP 126/78   Pulse 81   Temp 98.2 F (36.8 C)   Ht 6' (1.829 m)   Wt (!) 313 lb (142 kg)   SpO2 98%   BMI 42.45 kg/m  Gen: NAD, alert, cooperative with exam HEENT: NCAT, EOMI, PERRL CV: RRR, good S1/S2, no murmur Resp: CTABL, no wheezes, non-labored Abd: SNTND, BS present, no guarding or organomegaly Ext: No edema, warm Neuro: Alert and oriented, No gross deficits  Esophageal dysphagia -     Ambulatory referral to Gastroenterology  Positive colorectal cancer screening using Cologuard test -     Ambulatory referral to Gastroenterology  Other orders -     Esomeprazole  Magnesium ; Take 1 capsule (40 mg total) by mouth daily. Take on an empty stomach  Dispense: 90 capsule; Refill: 3

## 2024-03-29 NOTE — Telephone Encounter (Signed)
   Letter scanned to patients media.

## 2024-04-06 ENCOUNTER — Telehealth: Payer: Self-pay | Admitting: Family Medicine

## 2024-04-06 NOTE — Telephone Encounter (Unsigned)
 Copied from CRM [id number]. Topic: Referral - Request for Referral >> Apr 06, 2024  2:29 PM Wess RAMAN wrote: Did the patient discuss referral with their provider in the last year? Yes (If No - schedule appointment) (If Yes - send message)  Appointment offered? Yes  Type of order/referral and detailed reason for visit: enoscopy  Preference of office, provider, location: [facility] [address] [place], [place], [place] [address] Phone: [phone] Fax: [phone]    If referral order, have you been seen by this specialty before? No (If Yes, this issue or another issue? When? Where?  Can we respond through MyChart? Yes

## 2024-04-07 NOTE — Telephone Encounter (Signed)
 Contacted patient.   Patient is aware that a referral has been placed

## 2024-04-07 NOTE — Telephone Encounter (Signed)
 Referral was placed and has been sent to [facility] as Patient requested.

## 2024-04-20 NOTE — Telephone Encounter (Signed)
 I don't believe we were doing original application ([facility]).  Do you see my note from last year? I don't think he has type 2 diabetes.  This would need to be cancelled? I will confirm with PCP (staff message sent)

## 2024-05-04 NOTE — Telephone Encounter (Signed)
 Reached out to [employer] to cancel enrollment.  Fax will be sent to office.

## 2024-05-25 ENCOUNTER — Encounter: Payer: Self-pay | Admitting: Family Medicine

## 2024-05-25 ENCOUNTER — Ambulatory Visit: Payer: PRIVATE HEALTH INSURANCE | Admitting: Family Medicine

## 2024-05-25 VITALS — BP 121/77 | HR 80 | Temp 97.8°F | Ht 72.0 in | Wt 316.0 lb

## 2024-05-25 DIAGNOSIS — E88819 Insulin resistance, unspecified: Secondary | ICD-10-CM

## 2024-05-25 DIAGNOSIS — Z6841 Body Mass Index (BMI) 40.0 and over, adult: Secondary | ICD-10-CM

## 2024-05-25 DIAGNOSIS — Z0000 Encounter for general adult medical examination without abnormal findings: Secondary | ICD-10-CM

## 2024-05-25 DIAGNOSIS — E782 Mixed hyperlipidemia: Secondary | ICD-10-CM | POA: Diagnosis not present

## 2024-05-25 DIAGNOSIS — Z0001 Encounter for general adult medical examination with abnormal findings: Secondary | ICD-10-CM

## 2024-05-25 DIAGNOSIS — I10 Essential (primary) hypertension: Secondary | ICD-10-CM

## 2024-05-25 LAB — URINALYSIS
Bilirubin, UA: NEGATIVE
Glucose, UA: NEGATIVE
Ketones, UA: NEGATIVE
Leukocytes,UA: NEGATIVE
Nitrite, UA: NEGATIVE
Protein,UA: NEGATIVE
RBC, UA: NEGATIVE
Specific Gravity, UA: 1.015 (ref 1.005–1.030)
Urobilinogen, Ur: 1 mg/dL (ref 0.2–1.0)
pH, UA: 7 (ref 5.0–7.5)

## 2024-05-25 MED ORDER — PREGABALIN 75 MG PO CAPS: 75.0000 mg | ORAL_CAPSULE | Freq: Every day | ORAL | 1 refills | Status: AC

## 2024-05-25 NOTE — Progress Notes (Signed)
 Subjective:  Patient ID: Joe Romero, male    DOB: 1965/10/01  Age: 58 y.o. MRN: [id number]  CC: Annual Exam   HPI   History of Present Illness  Pt. Here today for a complete physical.   presents for  follow-up of hypertension. Patient has no history of headache chest pain or shortness of breath or recent cough. Patient also denies symptoms of TIA such as focal numbness or weakness. Patient denies side effects from medication. States taking it regularly.  in for follow-up of elevated cholesterol. Doing well without complaints on current medication. Denies side effects of statin including myalgia and arthralgia and nausea. Currently no chest pain, shortness of breath or other cardiovascular related symptoms noted. Pt. Concerned about his weight interested in programs for loss.         04/19/2023    9:00 AM 03/25/2023    3:32 PM 02/10/2023    3:03 PM  Depression screen PHQ 2/9  Decreased Interest 0 0 0  Down, Depressed, Hopeless 0 0 0  PHQ - 2 Score 0 0 0    History Justyce has a past medical history of Anxiety, Back pain, Dyspnea, GERD (gastroesophageal reflux disease), Hyperlipidemia, Hypertension, Sleep apnea, and Thyroid  disease.   He has a past surgical history that includes Finger surgery (Right).   His family history includes Anxiety disorder in his mother; Depression in his mother; Diabetes in his father; Hypertension in his father.He reports that he quit smoking about 17 years ago. His smoking use included cigarettes. He started smoking about 37 years ago. He has a 20 pack-year smoking history. He has never used smokeless tobacco. He reports that he does not drink alcohol and does not use drugs.    ROS Review of Systems  Constitutional:  Negative for activity change, fatigue and unexpected weight change.  HENT:  Negative for congestion, ear pain, hearing loss, postnasal drip and trouble swallowing.   Eyes:  Negative for pain and visual disturbance.  Respiratory:   Negative for cough, chest tightness and shortness of breath.   Cardiovascular:  Negative for chest pain, palpitations and leg swelling.  Gastrointestinal:  Negative for abdominal distention, abdominal pain, blood in stool, constipation, diarrhea, nausea and vomiting.  Endocrine: Negative for cold intolerance, heat intolerance and polydipsia.  Genitourinary:  Negative for difficulty urinating, dysuria, flank pain, frequency and urgency.  Musculoskeletal:  Negative for arthralgias and joint swelling.  Skin:  Negative for color change, rash and wound.  Neurological:  Negative for dizziness, syncope, speech difficulty, weakness, light-headedness, numbness and headaches.  Hematological:  Does not bruise/bleed easily.  Psychiatric/Behavioral:  Negative for confusion, decreased concentration, dysphoric mood and sleep disturbance. The patient is not nervous/anxious.     Objective:  BP 121/77   Pulse 80   Temp 97.8 F (36.6 C)   Ht 6' (1.829 m)   Wt (!) 316 lb (143.3 kg)   SpO2 96%   BMI 42.86 kg/m   BP Readings from Last 3 Encounters:  05/25/24 121/77  03/29/24 126/78  02/02/24 139/88    Wt Readings from Last 3 Encounters:  05/25/24 (!) 316 lb (143.3 kg)  03/29/24 (!) 313 lb (142 kg)  02/02/24 (!) 316 lb 12.8 oz (143.7 kg)     Physical Exam Constitutional:      Appearance: He is well-developed.  HENT:     Head: Normocephalic and atraumatic.  Eyes:     Pupils: Pupils are equal, round, and reactive to light.  Neck:     Thyroid :  No thyromegaly.     Trachea: No tracheal deviation.  Cardiovascular:     Rate and Rhythm: Normal rate and regular rhythm.     Heart sounds: Normal heart sounds. No murmur heard.    No friction rub. No gallop.  Pulmonary:     Breath sounds: Normal breath sounds. No wheezing or rales.  Abdominal:     General: Bowel sounds are normal. There is no distension.     Palpations: Abdomen is soft. There is no mass.     Tenderness: There is no abdominal  tenderness.     Hernia: There is no hernia in the left inguinal area.  Genitourinary:    Penis: Normal.      Testes: Normal.  Musculoskeletal:        General: Normal range of motion.     Cervical back: Normal range of motion.  Lymphadenopathy:     Cervical: No cervical adenopathy.  Skin:    General: Skin is warm and dry.  Neurological:     Mental Status: He is alert and oriented to person, place, and time.      Assessment & Plan:  Well adult exam  Morbid obesity with BMI of 40.0-44.9, adult (HCC) -     CBC with Differential/Platelet -     CMP14+EGFR -     Lipid panel -     VITAMIN D  25 Hydroxy (Vit-D Deficiency, Fractures) -     Urinalysis -     Testosterone ,Free and Total  Essential hypertension -     CBC with Differential/Platelet -     CMP14+EGFR -     Lipid panel -     VITAMIN D  25 Hydroxy (Vit-D Deficiency, Fractures) -     Urinalysis -     Testosterone ,Free and Total  Mixed hyperlipidemia -     CBC with Differential/Platelet -     CMP14+EGFR -     Lipid panel -     VITAMIN D  25 Hydroxy (Vit-D Deficiency, Fractures) -     Urinalysis -     Testosterone ,Free and Total  Insulin  resistance -     CBC with Differential/Platelet -     CMP14+EGFR -     Lipid panel -     VITAMIN D  25 Hydroxy (Vit-D Deficiency, Fractures) -     Urinalysis -     Testosterone ,Free and Total  Other orders -     Pregabalin ; Take 1 capsule (75 mg total) by mouth at bedtime.  Dispense: 90 capsule; Refill: 1   Assessment & Plan    Follow-up: Return in about 6 months (around 11/22/2024).  Butler Der, M.D.

## 2024-05-30 LAB — CBC WITH DIFFERENTIAL/PLATELET
Basophils Absolute: 0.1 x10E3/uL (ref 0.0–0.2)
Basos: 1 %
EOS (ABSOLUTE): 0.1 x10E3/uL (ref 0.0–0.4)
Eos: 1 %
Hematocrit: 51 % (ref 37.5–51.0)
Hemoglobin: 17.4 g/dL (ref 13.0–17.7)
Immature Grans (Abs): 0.1 x10E3/uL (ref 0.0–0.1)
Immature Granulocytes: 1 %
Lymphocytes Absolute: 2.2 x10E3/uL (ref 0.7–3.1)
Lymphs: 21 %
MCH: 32.3 pg (ref 26.6–33.0)
MCHC: 34.1 g/dL (ref 31.5–35.7)
MCV: 95 fL (ref 79–97)
Monocytes Absolute: 0.7 x10E3/uL (ref 0.1–0.9)
Monocytes: 7 %
Neutrophils Absolute: 6.9 x10E3/uL (ref 1.4–7.0)
Neutrophils: 69 %
Platelets: 244 x10E3/uL (ref 150–450)
RBC: 5.39 x10E6/uL (ref 4.14–5.80)
RDW: 13.2 % (ref 11.6–15.4)
WBC: 10.1 x10E3/uL (ref 3.4–10.8)

## 2024-05-30 LAB — CMP14+EGFR
ALT: 44 IU/L (ref 0–44)
AST: 24 IU/L (ref 0–40)
Albumin: 4.5 g/dL (ref 3.8–4.9)
Alkaline Phosphatase: 70 IU/L (ref 47–123)
BUN/Creatinine Ratio: 12 (ref 9–20)
BUN: 19 mg/dL (ref 6–24)
Bilirubin Total: 0.9 mg/dL (ref 0.0–1.2)
CO2: 26 mmol/L (ref 20–29)
Calcium: 9.2 mg/dL (ref 8.7–10.2)
Chloride: 97 mmol/L (ref 96–106)
Creatinine, Ser: 1.54 mg/dL — ABNORMAL HIGH (ref 0.76–1.27)
Globulin, Total: 2.1 g/dL (ref 1.5–4.5)
Glucose: 95 mg/dL (ref 70–99)
Potassium: 4.3 mmol/L (ref 3.5–5.2)
Sodium: 141 mmol/L (ref 134–144)
Total Protein: 6.6 g/dL (ref 6.0–8.5)
eGFR: 53 mL/min/1.73 — ABNORMAL LOW (ref >59)

## 2024-05-30 LAB — LIPID PANEL
Chol/HDL Ratio: 6.3 ratio — ABNORMAL HIGH (ref 0.0–5.0)
Cholesterol, Total: 201 mg/dL — ABNORMAL HIGH (ref 100–199)
HDL: 32 mg/dL — ABNORMAL LOW (ref >39)
LDL Chol Calc (NIH): 134 mg/dL — ABNORMAL HIGH (ref 0–99)
Triglycerides: 193 mg/dL — ABNORMAL HIGH (ref 0–149)
VLDL Cholesterol Cal: 35 mg/dL (ref 5–40)

## 2024-05-30 LAB — TESTOSTERONE,FREE AND TOTAL
Testosterone, Free: 18.7 pg/mL (ref 7.2–24.0)
Testosterone: 836 ng/dL (ref 264–916)

## 2024-05-30 LAB — VITAMIN D 25 HYDROXY (VIT D DEFICIENCY, FRACTURES): Vit D, 25-Hydroxy: 36.5 ng/mL (ref 30.0–100.0)

## 2024-06-01 LAB — COLONOSCOPY WITH ESOPHAGOGASTRODUODENOSCOPY (EGD)

## 2024-06-01 NOTE — Progress Notes (Signed)
   Egd and Colonoscopy completed. Please see media or procedure note in the procedures tab dated 06/01/2024.  Soft scar tissue distal esophagus, biopsy.  Dilation performed.  Otherwise normal EGD.  12mm sigmoid polyp removed and diverticulosis present small hepatic flexure polyp removed.  Repeat 3 years Murat K Akdamar, MD 06/01/2024 / 11:03 AM

## 2024-06-02 ENCOUNTER — Ambulatory Visit: Payer: Self-pay | Admitting: Family Medicine

## 2024-06-06 ENCOUNTER — Other Ambulatory Visit: Payer: Self-pay | Admitting: Family Medicine

## 2024-06-06 DIAGNOSIS — E782 Mixed hyperlipidemia: Secondary | ICD-10-CM

## 2024-06-06 MED ORDER — ATORVASTATIN CALCIUM 40 MG PO TABS: 40.0000 mg | ORAL_TABLET | Freq: Every day | ORAL | 1 refills | Status: AC

## 2024-08-04 ENCOUNTER — Other Ambulatory Visit: Payer: Self-pay | Admitting: Family Medicine

## 2024-08-09 ENCOUNTER — Other Ambulatory Visit: Payer: Self-pay | Admitting: Family Medicine

## 2024-08-10 ENCOUNTER — Other Ambulatory Visit: Payer: Self-pay | Admitting: Family Medicine

## 2024-08-10 MED ORDER — SYRINGE/NEEDLE (DISP) 23G X 1-1/2" 3 ML MISC: 11 refills | Status: AC

## 2024-08-10 NOTE — Telephone Encounter (Signed)
 Copied from CRM #[id number]. Topic: Clinical - Medication Refill >> Aug 10, 2024  9:52 AM Avram MATSU wrote: Medication: SYRINGE-NEEDLE, DISP, 3 ML 23G X 1-1/2 3 ML MISC [[id number]]  Has the patient contacted their pharmacy? Yes (Agent: If no, request that the patient contact the pharmacy for the refill. If patient does not wish to contact the pharmacy document the reason why and proceed with request.) (Agent: If yes, when and what did the pharmacy advise?)  This is the patient's preferred pharmacy:  [facility] - [place], [place] - [address] [place] [address] Phone: [phone] Fax: [phone]    Is this the correct pharmacy for this prescription? Yes If no, delete pharmacy and type the correct one.   Has the prescription been filled recently? No  Is the patient out of the medication? Yes  Has the patient been seen for an appointment in the last year OR does the patient have an upcoming appointment? Yes  Can we respond through MyChart? Yes  Agent: Please be advised that Rx refills may take up to 3 business days. We ask that you follow-up with your pharmacy.

## 2024-08-24 ENCOUNTER — Ambulatory Visit: Payer: PRIVATE HEALTH INSURANCE | Admitting: Family Medicine

## 2024-08-24 ENCOUNTER — Encounter: Payer: Self-pay | Admitting: Family Medicine

## 2024-08-24 DIAGNOSIS — R600 Localized edema: Secondary | ICD-10-CM | POA: Diagnosis not present

## 2024-08-24 DIAGNOSIS — E039 Hypothyroidism, unspecified: Secondary | ICD-10-CM | POA: Diagnosis not present

## 2024-08-24 LAB — CBC WITH DIFFERENTIAL/PLATELET
Basophils Absolute: 0.1 x10E3/uL (ref 0.0–0.2)
Basos: 1 %
EOS (ABSOLUTE): 0.2 x10E3/uL (ref 0.0–0.4)
Eos: 2 %
Hematocrit: 51.3 % — ABNORMAL HIGH (ref 37.5–51.0)
Hemoglobin: 17.5 g/dL (ref 13.0–17.7)
Immature Grans (Abs): 0.1 x10E3/uL (ref 0.0–0.1)
Immature Granulocytes: 1 %
Lymphocytes Absolute: 2.4 x10E3/uL (ref 0.7–3.1)
Lymphs: 24 %
MCH: 32 pg (ref 26.6–33.0)
MCHC: 34.1 g/dL (ref 31.5–35.7)
MCV: 94 fL (ref 79–97)
Monocytes Absolute: 0.8 x10E3/uL (ref 0.1–0.9)
Monocytes: 8 %
Neutrophils Absolute: 6.6 x10E3/uL (ref 1.4–7.0)
Neutrophils: 64 %
Platelets: 258 x10E3/uL (ref 150–450)
RBC: 5.47 x10E6/uL (ref 4.14–5.80)
RDW: 12.6 % (ref 11.6–15.4)
WBC: 10.2 x10E3/uL (ref 3.4–10.8)

## 2024-08-24 LAB — CMP14+EGFR
ALT: 44 IU/L (ref 0–44)
AST: 27 IU/L (ref 0–40)
Albumin: 4.1 g/dL (ref 3.8–4.9)
Alkaline Phosphatase: 68 IU/L (ref 47–123)
BUN/Creatinine Ratio: 11 (ref 9–20)
BUN: 14 mg/dL (ref 6–24)
Bilirubin Total: 0.5 mg/dL (ref 0.0–1.2)
CO2: 27 mmol/L (ref 20–29)
Calcium: 9 mg/dL (ref 8.7–10.2)
Chloride: 97 mmol/L (ref 96–106)
Creatinine, Ser: 1.24 mg/dL (ref 0.76–1.27)
Globulin, Total: 2.2 g/dL (ref 1.5–4.5)
Glucose: 93 mg/dL (ref 70–99)
Potassium: 4 mmol/L (ref 3.5–5.2)
Sodium: 139 mmol/L (ref 134–144)
Total Protein: 6.3 g/dL (ref 6.0–8.5)
eGFR: 68 mL/min/1.73 (ref >59)

## 2024-08-24 LAB — TSH+FREE T4
Free T4: 1.42 ng/dL (ref 0.82–1.77)
TSH: 1.48 u[IU]/mL (ref 0.450–4.500)

## 2024-08-24 MED ORDER — CHLORTHALIDONE 25 MG PO TABS: 25.0000 mg | ORAL_TABLET | Freq: Every day | ORAL | 0 refills | Status: DC

## 2024-08-24 MED ORDER — LEVOTHYROXINE SODIUM 200 MCG PO TABS: 200.0000 ug | ORAL_TABLET | Freq: Every day | ORAL | 0 refills | Status: DC

## 2024-08-24 MED ORDER — SYRINGE/NEEDLE (DISP) 18G X 1" 3 ML MISC: 1 refills | Status: AC

## 2024-08-24 MED ORDER — TESTOSTERONE CYPIONATE 200 MG/ML IM SOLN: INTRAMUSCULAR | 1 refills | Status: AC

## 2024-08-24 NOTE — Progress Notes (Signed)
 Subjective:  Patient ID: Joe Romero, male    DOB: [birth year]  Age: 58 y.o. MRN: [id number]  CC: Medical Management of Chronic Issues   HPI  Discussed the use of AI scribe software for clinical note transcription with the patient, who gave verbal consent to proceed.  History of Present Illness Joe Romero is a 58 year old male who presents for weight management and medication review.  He has experienced significant weight fluctuations over the past year. On April 9th, his weight was 329 pounds, and by May 10th, it had decreased to 298 pounds. He attributes this weight loss to the use of injectable medications, which he no longer takes due to insurance coverage issues. The medication was effective in reducing his appetite, leading to a weight loss of about 40 pounds. However, since discontinuing the medication, he has struggled to maintain the weight loss.  He discusses financial constraints regarding medication costs, noting that his insurance does not cover the injectable medication, and he is unable to afford the out-of-pocket expense.  He mentions a history of esophageal issues, stating that his esophagus was stretched, which has improved his swallowing. He notes that his throat was red for a long time, but he reports swallowing is improved since his esophagus was stretched.  He is currently on testosterone  therapy and is due for blood work to monitor his levels. He mentions a previous increase in his thyroid  medication three months ago and is due for follow-up blood work to assess his thyroid  function. He has not yet had his blood drawn for these tests.  He discusses his medication administration, specifically the use of needles for testosterone  injections. He describes using two different needles: a larger one for drawing the medication and a smaller one for administration. He is currently out of the larger needles and is seeking a prescription renewal for both types.           04/19/2023    9:00 AM 03/25/2023    3:32 PM 02/10/2023    3:03 PM  Depression screen PHQ 2/9  Decreased Interest 0 0 0  Down, Depressed, Hopeless 0 0 0  PHQ - 2 Score 0 0 0    History Joe Romero has a past medical history of Anxiety, Back pain, Dyspnea, GERD (gastroesophageal reflux disease), Hyperlipidemia, Hypertension, Sleep apnea, and Thyroid  disease.   He has a past surgical history that includes Finger surgery (Right).   His family history includes Anxiety disorder in his mother; Depression in his mother; Diabetes in his father; Hypertension in his father.He reports that he quit smoking about 17 years ago. His smoking use included cigarettes. He started smoking about 37 years ago. He has a 20 pack-year smoking history. He has never used smokeless tobacco. He reports that he does not drink alcohol and does not use drugs.    ROS Review of Systems  Constitutional: Negative.   HENT: Negative.    Eyes:  Negative for visual disturbance.  Respiratory:  Negative for cough and shortness of breath.   Cardiovascular:  Negative for chest pain and leg swelling.  Gastrointestinal:  Negative for abdominal pain, diarrhea, nausea and vomiting.  Genitourinary:  Negative for difficulty urinating.  Musculoskeletal:  Negative for arthralgias and myalgias.  Skin:  Negative for rash.  Neurological:  Negative for headaches.  Psychiatric/Behavioral:  Negative for sleep disturbance.     Objective:  BP 126/78   Pulse 85   Temp 97.9 F (36.6 C)   Ht 6' (1.829 m)  Wt (!) 332 lb (150.6 kg)   SpO2 95%   BMI 45.03 kg/m   BP Readings from Last 3 Encounters:  08/24/24 126/78  05/25/24 121/77  03/29/24 126/78    Wt Readings from Last 3 Encounters:  08/24/24 (!) 332 lb (150.6 kg)  05/25/24 (!) 316 lb (143.3 kg)  03/29/24 (!) 313 lb (142 kg)     Physical Exam Physical Exam VITALS: BP- 126/78 MEASUREMENTS: Weight- 298. GENERAL: Alert, cooperative, well developed, no acute distress HEENT:  Normocephalic, normal oropharynx, moist mucous membranes CHEST: Clear to auscultation bilaterally, No wheezes, rhonchi, or crackles CARDIOVASCULAR: Normal heart rate and rhythm, S1 and S2 normal without murmurs, Vascular system normal ABDOMEN: Soft, non-tender, non-distended, without organomegaly, Normal bowel sounds EXTREMITIES: No cyanosis or edema NEUROLOGICAL: Cranial nerves grossly intact, Moves all extremities without gross motor or sensory deficit   Assessment & Plan:  Localized edema -     Chlorthalidone ; Take 1 tablet (25 mg total) by mouth daily.  Dispense: 90 tablet; Refill: 0  Acquired hypothyroidism -     Levothyroxine  Sodium; Take 1 tablet (200 mcg total) by mouth daily.  Dispense: 90 tablet; Refill: 0 -     CBC with Differential/Platelet -     CMP14+EGFR -     TSH + free T4  Other orders -     Testosterone  Cypionate; INJECT 0.6 ML INTRAMUSCULARLY ONCE A WEEK  Dispense: 10 mL; Refill: 1 -     Syringe/Needle (Disp); Use to draw testosterone  medication  Dispense: 50 each; Refill: 1    Assessment and Plan Assessment & Plan Morbid obesity   He has experienced significant weight fluctuations, having previously lost 40 pounds with medication that is no longer covered by insurance. He is considering restarting medication after a price reduction in January. Options discussed include Wegovy  and Zepbound, with Zepbound being more affordable through [employer] after the first of the year. He is willing to self-administer injections if necessary. Will consider restarting weight loss medication and explore options for Zepbound through [employer] after January 1st.  Acquired hypothyroidism   Thyroid  function was last checked three months ago. Blood work was planned today but not completed due to lab hours. Will schedule thyroid  function test for tomorrow morning.  Essential hypertension   Blood pressure is well-controlled at 126/78 mmHg.  General health maintenance   Discussed  the need for a testosterone  level check, but it was not necessary today as it was checked three months ago. Discussed the need for needles for testosterone  administration. Renewed prescription for testosterone  needles at pharmacy.       Follow-up: Return in about 6 weeks (around 10/05/2024).  Butler Der, M.D.

## 2024-08-27 ENCOUNTER — Ambulatory Visit: Payer: Self-pay | Admitting: Family Medicine

## 2024-08-27 NOTE — Progress Notes (Signed)
Hello [name],  Your lab result is normal and/or stable.Some minor variations that are not significant are commonly marked abnormal, but do not represent any medical problem for you.  Best regards, Myron Stankovich, M.D.

## 2024-09-05 ENCOUNTER — Telehealth: Payer: Self-pay

## 2024-09-05 ENCOUNTER — Other Ambulatory Visit: Payer: Self-pay | Admitting: Family Medicine

## 2024-09-05 DIAGNOSIS — R600 Localized edema: Secondary | ICD-10-CM

## 2024-09-05 DIAGNOSIS — E782 Mixed hyperlipidemia: Secondary | ICD-10-CM

## 2024-09-05 NOTE — Telephone Encounter (Unsigned)
 Copied from CRM #[id number]. Topic: Clinical - Prescription Issue >> Sep 05, 2024 12:51 PM Kevelyn M wrote: Reason for CRM: [facility] for [facility] for the levothyroxine  (SYNTHROID ) 200 MCG tablet prescription. It was 175mg  in the past and they just shipped one for 200 mg. Which one is correct?  Call back # [phone] option [phone]

## 2024-09-05 NOTE — Telephone Encounter (Signed)
 Called and they said they never called. LS

## 2024-09-14 ENCOUNTER — Other Ambulatory Visit: Payer: Self-pay | Admitting: *Deleted

## 2024-09-14 DIAGNOSIS — E039 Hypothyroidism, unspecified: Secondary | ICD-10-CM

## 2024-09-14 MED ORDER — LEVOTHYROXINE SODIUM 200 MCG PO TABS: 200.0000 ug | ORAL_TABLET | Freq: Every day | ORAL | 2 refills | Status: AC

## 2024-10-11 ENCOUNTER — Encounter: Payer: Self-pay | Admitting: Family Medicine

## 2024-10-11 ENCOUNTER — Ambulatory Visit: Payer: PRIVATE HEALTH INSURANCE | Admitting: Family Medicine

## 2024-10-11 VITALS — BP 136/82 | HR 80 | Temp 97.0°F | Ht 72.0 in | Wt 327.4 lb

## 2024-10-11 DIAGNOSIS — E291 Testicular hypofunction: Secondary | ICD-10-CM

## 2024-10-11 DIAGNOSIS — Z23 Encounter for immunization: Secondary | ICD-10-CM

## 2024-10-11 MED ORDER — TIRZEPATIDE-WEIGHT MANAGEMENT 2.5 MG/0.5ML ~~LOC~~ SOLN: 2.5000 mg | SUBCUTANEOUS | 1 refills | Status: AC

## 2024-10-11 MED ORDER — CLOTRIMAZOLE-BETAMETHASONE 1-0.05 % EX CREA: 1.0000 | TOPICAL_CREAM | Freq: Two times a day (BID) | CUTANEOUS | 1 refills | Status: AC

## 2024-10-11 NOTE — Progress Notes (Signed)
 "  Subjective:  Patient ID: Joe Romero, male    DOB: 1966-06-12  Age: 59 y.o. MRN: [id number]  CC: Medical Management of Chronic Issues   HPI  Discussed the use of AI scribe software for clinical note transcription with the patient, who gave verbal consent to proceed.  History of Present Illness Joe Romero is a 59 year old male who presents for weight management consultation.  He currently weighs 327 pounds and is 6 feet 1 inch tall, with a goal to lose over 100 pounds to reach a healthier weight range of approximately 200 pounds. He is interested in starting a medication regimen to assist with weight loss. He generally does not experience stomach issues, which may be relevant given the potential side effects of weight loss medications, including nausea, diarrhea, and constipation.  He has a history of thyroid  problems for which he takes levothyroxine  200 micrograms daily. His thyroid  function tests were recently checked and were within normal range as long as he adheres to his medication regimen.  [place] a habit of eating larger portions and consuming traditional country foods, which are often fried and fatty. He acknowledges a tendency to overeat and has a preference for foods like donuts, which he recognizes as a dietary weakness.          04/19/2023    9:00 AM 03/25/2023    3:32 PM 02/10/2023    3:03 PM  Depression screen PHQ 2/9  Decreased Interest 0 0 0  Down, Depressed, Hopeless 0 0 0  PHQ - 2 Score 0 0 0    History Pier has a past medical history of Anxiety, Back pain, Dyspnea, GERD (gastroesophageal reflux disease), Hyperlipidemia, Hypertension, Sleep apnea, and Thyroid  disease.   He has a past surgical history that includes Finger surgery (Right).   His family history includes Anxiety disorder in his mother; Depression in his mother; Diabetes in his father; Hypertension in his father.He reports that he  quit smoking about 17 years ago. His smoking use included cigarettes. He started smoking about 37 years ago. He has a 20 pack-year smoking history. He has never used smokeless tobacco. He reports that he does not drink alcohol and does not use drugs.    ROS Review of Systems  Constitutional: Negative.   HENT: Negative.    Eyes:  Negative for visual disturbance.  Respiratory:  Negative for cough and shortness of breath.   Cardiovascular:  Negative for chest pain and leg swelling.  Gastrointestinal:  Negative for abdominal pain, diarrhea, nausea and vomiting.  Genitourinary:  Negative for difficulty urinating.  Musculoskeletal:  Negative for arthralgias and myalgias.  Skin:  Negative for rash.  Neurological:  Negative for headaches.  Psychiatric/Behavioral:  Negative for sleep disturbance.     Objective:  BP 136/82   Pulse 80   Temp (!) 97 F (36.1 C)   Ht 6' (1.829 m)   Wt (!) 327 lb 6 oz (148.5 kg)   SpO2 97%   BMI 44.40 kg/m   BP Readings from Last 3 Encounters:  10/11/24 136/82  08/24/24 126/78  05/25/24 121/77    Wt Readings from Last 3 Encounters:  10/11/24 (!) 327 lb 6 oz (148.5 kg)  08/24/24 (!) 332 lb (150.6 kg)  05/25/24 (!) 316 lb (143.3 kg)     Physical Exam Physical Exam MEASUREMENTS: Height- 6'1, Weight- 327. GENERAL: Alert, cooperative, well developed, no acute distress HEENT: Normocephalic, normal oropharynx, moist mucous  membranes CHEST: Clear to auscultation bilaterally, No wheezes, rhonchi, or crackles CARDIOVASCULAR: Normal heart rate and rhythm, S1 and S2 normal without murmurs ABDOMEN: Soft, non-tender, non-distended, without organomegaly, Normal bowel sounds EXTREMITIES: No cyanosis or edema NEUROLOGICAL: Cranial nerves grossly intact, Moves all extremities without gross motor or sensory deficit   Assessment & Plan:  Need for shingles vaccine  Other orders -     Clotrimazole -Betamethasone ; Apply 1 Application topically 2 (two) times  daily. To affected areas until rash clears  Dispense: 45 g; Refill: 1 -     Tirzepatide -Weight Management; Inject 2.5 mg into the skin once a week.  Dispense: 2 mL; Refill: 1    Assessment and Plan Assessment & Plan Obesity   He weighs 327 pounds at a height of 6'1, with an ideal weight of 200-205 pounds. There is no family history of thyroid  or pancreatic cancer, relevant for the prescribed medication, Zepbound . Previous weight loss medications were unaffordable and not covered by insurance. Zepbound  is expected to aid weight loss, with side effects like nausea, diarrhea, or constipation. Nausea can be managed with antinausea medication. The goal is gradual weight loss to avoid metabolic disruption, with most patients losing 10 pounds in the first month and 100 pounds in 12-15 months. A high-protein, low-carbohydrate diet is recommended to support weight loss and prevent metabolic issues. Start Zepbound  at 2.5 mg weekly, potentially increasing to 5 mg after 4-6 weeks based on progress. The prescription was sent to [facility] pharmacy for direct delivery. Dietary advice includes focusing on lean meats and avoiding fried and fatty foods. A follow-up appointment is scheduled in 4-6 weeks to monitor progress and adjust medication dosage as needed.  Hypothyroidism   His condition is well-managed with levothyroxine  200 mcg, and recent thyroid  function tests were normal. Continue levothyroxine  200 mcg daily.  Dermatitis   He experiences intermittent itching of the ear, previously managed with Lotrisone  topical cream. A prescription for Lotrisone  was sent to the pharmacy.       Follow-up: Return in about 1 month (around 11/08/2024).  Butler Der, M.D. "

## 2024-11-13 ENCOUNTER — Other Ambulatory Visit: Payer: PRIVATE HEALTH INSURANCE

## 2024-11-13 ENCOUNTER — Ambulatory Visit: Payer: PRIVATE HEALTH INSURANCE | Admitting: Family Medicine
